# Patient Record
Sex: Female | Born: 1960 | Race: White | Hispanic: No | Marital: Married | Smoking: Never smoker
Health system: Southern US, Community
[De-identification: ages and names within clinical notes are randomized; demographics above are authoritative.]

## PROBLEM LIST (undated history)

## (undated) DIAGNOSIS — C569 Malignant neoplasm of unspecified ovary: Secondary | ICD-10-CM

## (undated) DIAGNOSIS — M069 Rheumatoid arthritis, unspecified: Secondary | ICD-10-CM

## (undated) HISTORY — PX: VAGINAL HYSTERECTOMY: SUR661

## (undated) HISTORY — DX: Rheumatoid arthritis, unspecified: M06.9

## (undated) HISTORY — DX: Malignant neoplasm of unspecified ovary: C56.9

---

## 1998-10-04 ENCOUNTER — Other Ambulatory Visit: Admission: RE | Admit: 1998-10-04 | Discharge: 1998-10-04 | Payer: Self-pay | Admitting: Obstetrics & Gynecology

## 2005-04-17 ENCOUNTER — Emergency Department (HOSPITAL_COMMUNITY): Admission: EM | Admit: 2005-04-17 | Discharge: 2005-04-18 | Payer: Self-pay | Admitting: Family Medicine

## 2005-04-26 ENCOUNTER — Ambulatory Visit: Payer: Self-pay | Admitting: Obstetrics and Gynecology

## 2005-05-21 ENCOUNTER — Ambulatory Visit: Payer: Self-pay | Admitting: Oncology

## 2005-06-22 ENCOUNTER — Encounter: Admission: RE | Admit: 2005-06-22 | Discharge: 2005-06-22 | Payer: Self-pay | Admitting: Oncology

## 2005-06-22 ENCOUNTER — Ambulatory Visit (HOSPITAL_COMMUNITY): Admission: RE | Admit: 2005-06-22 | Discharge: 2005-06-22 | Payer: Self-pay | Admitting: Oncology

## 2005-07-13 ENCOUNTER — Ambulatory Visit: Payer: Self-pay | Admitting: Oncology

## 2005-08-28 ENCOUNTER — Ambulatory Visit: Payer: Self-pay | Admitting: Oncology

## 2005-11-07 ENCOUNTER — Ambulatory Visit (HOSPITAL_COMMUNITY): Admission: RE | Admit: 2005-11-07 | Discharge: 2005-11-07 | Payer: Self-pay | Admitting: Oncology

## 2005-11-21 ENCOUNTER — Ambulatory Visit: Admission: RE | Admit: 2005-11-21 | Discharge: 2005-11-21 | Payer: Self-pay | Admitting: Gynecology

## 2006-01-01 ENCOUNTER — Ambulatory Visit (HOSPITAL_COMMUNITY): Admission: RE | Admit: 2006-01-01 | Discharge: 2006-01-01 | Payer: Self-pay | Admitting: Internal Medicine

## 2006-01-01 ENCOUNTER — Ambulatory Visit: Payer: Self-pay | Admitting: Internal Medicine

## 2006-01-02 ENCOUNTER — Ambulatory Visit (HOSPITAL_COMMUNITY): Admission: RE | Admit: 2006-01-02 | Discharge: 2006-01-02 | Payer: Self-pay | Admitting: Oncology

## 2006-01-09 ENCOUNTER — Ambulatory Visit: Payer: Self-pay | Admitting: Oncology

## 2006-02-05 ENCOUNTER — Ambulatory Visit: Payer: Self-pay | Admitting: Internal Medicine

## 2006-02-07 ENCOUNTER — Ambulatory Visit (HOSPITAL_COMMUNITY): Admission: RE | Admit: 2006-02-07 | Discharge: 2006-02-07 | Payer: Self-pay | Admitting: Family Medicine

## 2006-03-01 ENCOUNTER — Ambulatory Visit: Payer: Self-pay | Admitting: Internal Medicine

## 2006-03-12 ENCOUNTER — Ambulatory Visit: Payer: Self-pay | Admitting: Internal Medicine

## 2006-03-14 ENCOUNTER — Ambulatory Visit: Payer: Self-pay | Admitting: Oncology

## 2006-03-18 ENCOUNTER — Ambulatory Visit (HOSPITAL_COMMUNITY): Admission: RE | Admit: 2006-03-18 | Discharge: 2006-03-18 | Payer: Self-pay | Admitting: Oncology

## 2006-03-21 ENCOUNTER — Encounter: Admission: RE | Admit: 2006-03-21 | Discharge: 2006-06-19 | Payer: Self-pay | Admitting: Internal Medicine

## 2006-03-22 ENCOUNTER — Ambulatory Visit: Admission: RE | Admit: 2006-03-22 | Discharge: 2006-03-22 | Payer: Self-pay | Admitting: Gynecology

## 2006-04-02 ENCOUNTER — Ambulatory Visit: Payer: Self-pay | Admitting: Internal Medicine

## 2006-04-09 ENCOUNTER — Ambulatory Visit: Payer: Self-pay | Admitting: Internal Medicine

## 2006-04-19 LAB — CBC WITH DIFFERENTIAL/PLATELET
BASO%: 0.5 % (ref 0.0–2.0)
Basophils Absolute: 0 10*3/uL (ref 0.0–0.1)
EOS%: 2.8 % (ref 0.0–7.0)
Eosinophils Absolute: 0.2 10*3/uL (ref 0.0–0.5)
HCT: 32.6 % — ABNORMAL LOW (ref 34.8–46.6)
HGB: 10.7 g/dL — ABNORMAL LOW (ref 11.6–15.9)
LYMPH%: 33.4 % (ref 14.0–48.0)
MCH: 30.5 pg (ref 26.0–34.0)
MCHC: 33 g/dL (ref 32.0–36.0)
MCV: 92.6 fL (ref 81.0–101.0)
MONO#: 0.6 10*3/uL (ref 0.1–0.9)
MONO%: 7.9 % (ref 0.0–13.0)
NEUT#: 4.3 10*3/uL (ref 1.5–6.5)
NEUT%: 55.4 % (ref 39.6–76.8)
Platelets: 288 10*3/uL (ref 145–400)
RBC: 3.52 10*6/uL — ABNORMAL LOW (ref 3.70–5.32)
RDW: 14.6 % — ABNORMAL HIGH (ref 11.3–14.5)
WBC: 7.7 10*3/uL (ref 3.9–10.0)
lymph#: 2.6 10*3/uL (ref 0.9–3.3)

## 2006-04-19 LAB — COMPREHENSIVE METABOLIC PANEL
ALT: 32 U/L (ref 0–40)
AST: 34 U/L (ref 0–37)
Albumin: 3.6 g/dL (ref 3.5–5.2)
Alkaline Phosphatase: 51 U/L (ref 39–117)
BUN: 15 mg/dL (ref 6–23)
CO2: 28 mEq/L (ref 19–32)
Calcium: 9 mg/dL (ref 8.4–10.5)
Chloride: 102 mEq/L (ref 96–112)
Creatinine, Ser: 0.8 mg/dL (ref 0.40–1.20)
Glucose, Bld: 89 mg/dL (ref 70–99)
Potassium: 4.6 mEq/L (ref 3.5–5.3)
Sodium: 138 mEq/L (ref 135–145)
Total Bilirubin: 0.3 mg/dL (ref 0.3–1.2)
Total Protein: 7.8 g/dL (ref 6.0–8.3)

## 2006-04-22 ENCOUNTER — Observation Stay (HOSPITAL_COMMUNITY): Admission: AD | Admit: 2006-04-22 | Discharge: 2006-04-23 | Payer: Self-pay | Admitting: Internal Medicine

## 2006-04-22 ENCOUNTER — Ambulatory Visit: Payer: Self-pay | Admitting: Internal Medicine

## 2006-05-01 ENCOUNTER — Ambulatory Visit: Payer: Self-pay | Admitting: Internal Medicine

## 2006-05-17 ENCOUNTER — Ambulatory Visit: Payer: Self-pay | Admitting: Oncology

## 2006-06-13 ENCOUNTER — Ambulatory Visit: Payer: Self-pay | Admitting: Internal Medicine

## 2006-06-25 ENCOUNTER — Ambulatory Visit (HOSPITAL_COMMUNITY): Admission: RE | Admit: 2006-06-25 | Discharge: 2006-06-25 | Payer: Self-pay | Admitting: Internal Medicine

## 2006-06-28 ENCOUNTER — Ambulatory Visit: Payer: Self-pay | Admitting: Oncology

## 2006-07-03 ENCOUNTER — Encounter: Admission: RE | Admit: 2006-07-03 | Discharge: 2006-07-03 | Payer: Self-pay | Admitting: Internal Medicine

## 2006-07-22 ENCOUNTER — Ambulatory Visit: Payer: Self-pay | Admitting: Internal Medicine

## 2006-07-22 ENCOUNTER — Ambulatory Visit (HOSPITAL_COMMUNITY): Admission: RE | Admit: 2006-07-22 | Discharge: 2006-07-22 | Payer: Self-pay | Admitting: Internal Medicine

## 2006-07-29 ENCOUNTER — Ambulatory Visit: Payer: Self-pay | Admitting: Hospitalist

## 2006-07-31 ENCOUNTER — Ambulatory Visit (HOSPITAL_COMMUNITY): Admission: RE | Admit: 2006-07-31 | Discharge: 2006-07-31 | Payer: Self-pay | Admitting: *Deleted

## 2006-08-09 LAB — CBC WITH DIFFERENTIAL/PLATELET
BASO%: 0.6 % (ref 0.0–2.0)
EOS%: 2.8 % (ref 0.0–7.0)
LYMPH%: 41.6 % (ref 14.0–48.0)
MCH: 31.9 pg (ref 26.0–34.0)
MCHC: 33.8 g/dL (ref 32.0–36.0)
MCV: 94.4 fL (ref 81.0–101.0)
MONO#: 0.5 10*3/uL (ref 0.1–0.9)
MONO%: 8.8 % (ref 0.0–13.0)
Platelets: 270 10*3/uL (ref 145–400)
RBC: 3.45 10*6/uL — ABNORMAL LOW (ref 3.70–5.32)
WBC: 5.5 10*3/uL (ref 3.9–10.0)

## 2006-08-09 LAB — COMPREHENSIVE METABOLIC PANEL
ALT: 24 U/L (ref 0–40)
Alkaline Phosphatase: 50 U/L (ref 39–117)
Sodium: 137 mEq/L (ref 135–145)
Total Bilirubin: 0.3 mg/dL (ref 0.3–1.2)
Total Protein: 7.7 g/dL (ref 6.0–8.3)

## 2006-08-26 ENCOUNTER — Ambulatory Visit: Payer: Self-pay | Admitting: Internal Medicine

## 2006-09-23 ENCOUNTER — Ambulatory Visit: Payer: Self-pay | Admitting: Oncology

## 2006-10-21 ENCOUNTER — Ambulatory Visit: Payer: Self-pay | Admitting: Internal Medicine

## 2006-10-21 ENCOUNTER — Encounter (INDEPENDENT_AMBULATORY_CARE_PROVIDER_SITE_OTHER): Payer: Self-pay | Admitting: Internal Medicine

## 2006-10-21 LAB — CONVERTED CEMR LAB
AST: 40 units/L — ABNORMAL HIGH (ref 0–37)
Alkaline Phosphatase: 45 units/L (ref 39–117)
BUN: 10 mg/dL (ref 6–23)
Calcium: 9.3 mg/dL (ref 8.4–10.5)
Creatinine, Ser: 0.7 mg/dL (ref 0.40–1.20)
HCT: 34.6 % (ref 34.4–43.3)
Hemoglobin: 11.8 g/dL (ref 11.7–14.8)
MCHC: 34 g/dL (ref 33.1–35.4)
MCV: 97.3 fL (ref 78.8–100.0)
RDW: 14.5 % (ref 11.5–15.3)

## 2006-10-23 ENCOUNTER — Ambulatory Visit: Admission: RE | Admit: 2006-10-23 | Discharge: 2006-10-23 | Payer: Self-pay | Admitting: Gynecology

## 2006-10-31 ENCOUNTER — Ambulatory Visit: Payer: Self-pay | Admitting: Internal Medicine

## 2006-11-07 ENCOUNTER — Ambulatory Visit: Payer: Self-pay | Admitting: Oncology

## 2007-01-09 ENCOUNTER — Encounter (INDEPENDENT_AMBULATORY_CARE_PROVIDER_SITE_OTHER): Payer: Self-pay | Admitting: Internal Medicine

## 2007-02-05 ENCOUNTER — Ambulatory Visit: Payer: Self-pay | Admitting: Oncology

## 2007-02-06 ENCOUNTER — Encounter (INDEPENDENT_AMBULATORY_CARE_PROVIDER_SITE_OTHER): Payer: Self-pay | Admitting: Internal Medicine

## 2007-02-06 LAB — COMPREHENSIVE METABOLIC PANEL
Albumin: 3.6 g/dL (ref 3.5–5.2)
BUN: 14 mg/dL (ref 6–23)
CO2: 28 mEq/L (ref 19–32)
Calcium: 9.5 mg/dL (ref 8.4–10.5)
Chloride: 103 mEq/L (ref 96–112)
Glucose, Bld: 80 mg/dL (ref 70–99)
Potassium: 4.3 mEq/L (ref 3.5–5.3)
Sodium: 138 mEq/L (ref 135–145)
Total Protein: 8 g/dL (ref 6.0–8.3)

## 2007-02-06 LAB — CBC WITH DIFFERENTIAL/PLATELET
Basophils Absolute: 0 10*3/uL (ref 0.0–0.1)
Eosinophils Absolute: 0.1 10*3/uL (ref 0.0–0.5)
HGB: 12 g/dL (ref 11.6–15.9)
MCV: 95.5 fL (ref 81.0–101.0)
MONO#: 0.5 10*3/uL (ref 0.1–0.9)
MONO%: 8.4 % (ref 0.0–13.0)
NEUT#: 3.1 10*3/uL (ref 1.5–6.5)
RBC: 3.57 10*6/uL — ABNORMAL LOW (ref 3.70–5.32)
RDW: 13.9 % (ref 11.3–14.5)
WBC: 5.7 10*3/uL (ref 3.9–10.0)
lymph#: 2 10*3/uL (ref 0.9–3.3)

## 2007-02-17 ENCOUNTER — Ambulatory Visit (HOSPITAL_COMMUNITY): Admission: RE | Admit: 2007-02-17 | Discharge: 2007-02-17 | Payer: Self-pay | Admitting: Oncology

## 2007-02-17 IMAGING — US US PELVIS COMPLETE MODIFY
1 series · 14 of 25 positions shown · non-contrast
Comparison: none

CLINICAL DATA: Pelvic pain and swelling.
 TRANSABDOMINAL AND TRANSVAGINAL PELVIC ULTRASOUND:
TECHNIQUE: Both transabdominal and transvaginal ultrasound examinations of the pelvis were performed including evaluation of the uterus, ovaries, adnexal regions, and pelvic cul-de-sac.
 The uterus is anteverted measuring 10.5 x 6.6 x 6.9 cm.  Endometrial stripe is homogeneous measuring 11 mm in greatest diameter.  There is a large complex cystic/solid right adnexal mass measuring 16 x 9 x 12 cm.  Right ovary is not well visualized.  Limited evaluation of left ovary which is grossly unremarkable.  Small amount of free fluid in the pelvis.

[Series 1: unknown · 0.25mm/px · 14 of 61 slices shown]
[im 1/61]
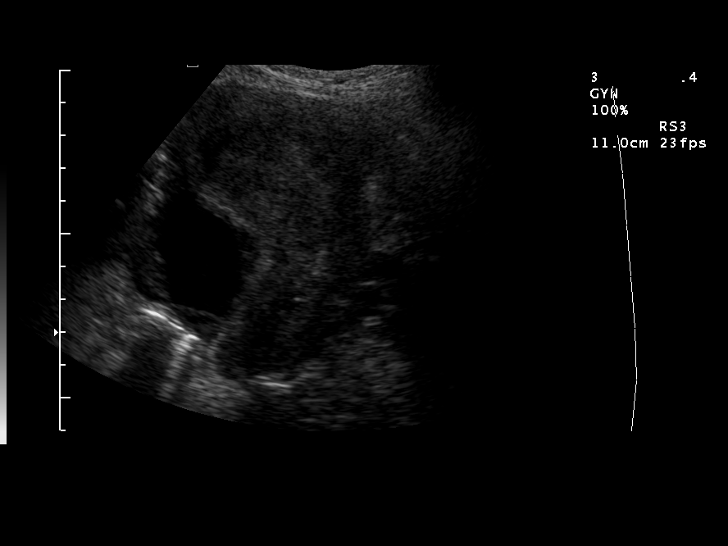
[im 6/61]
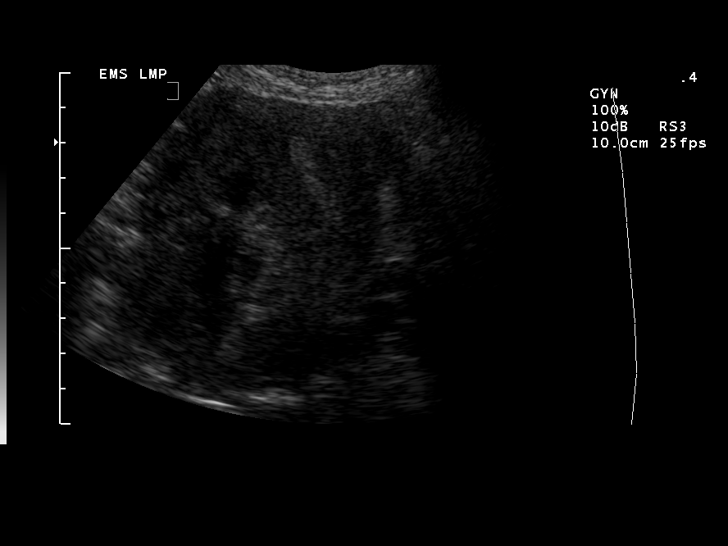
[im 11/61]
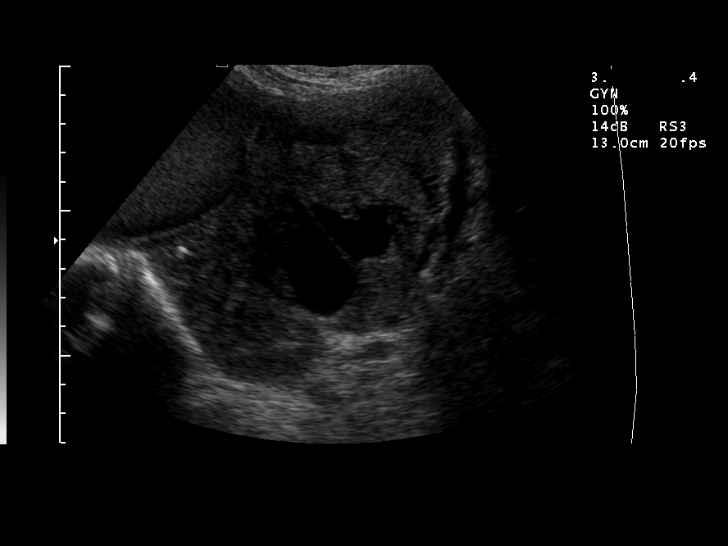
[im 16/61]
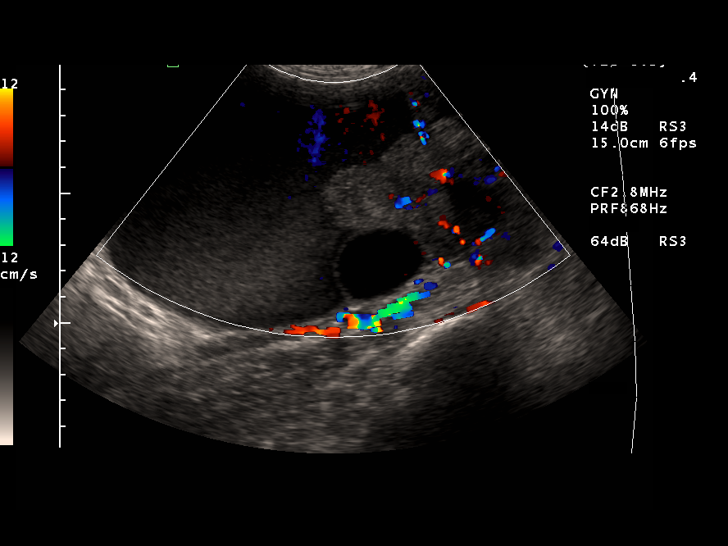
[im 21/61]
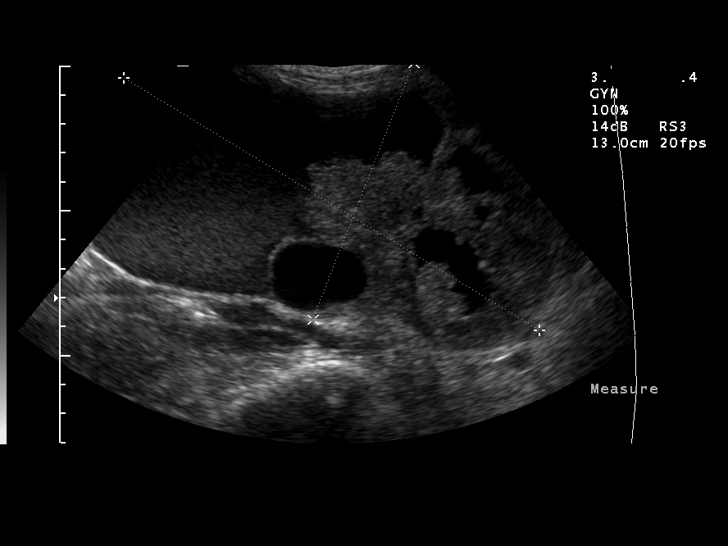
[im 23/61]
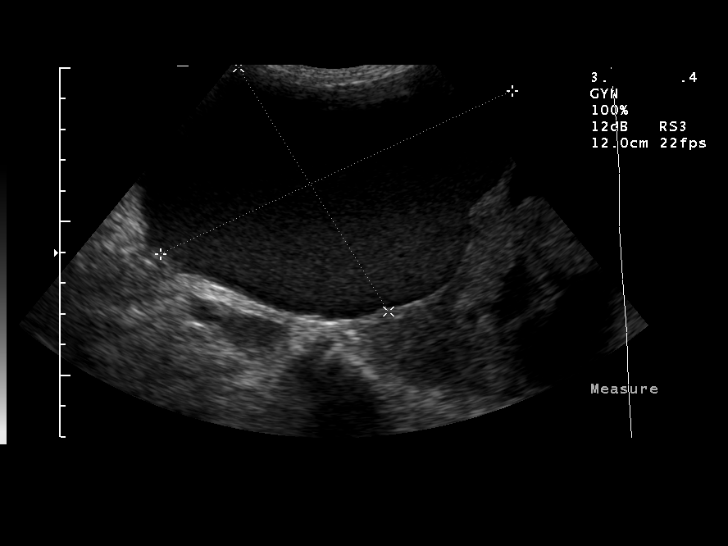
[im 28/61]
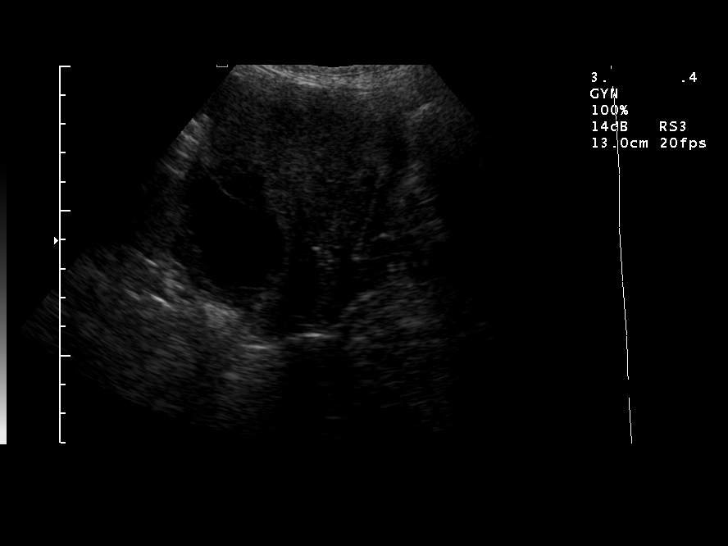
[im 33/61]
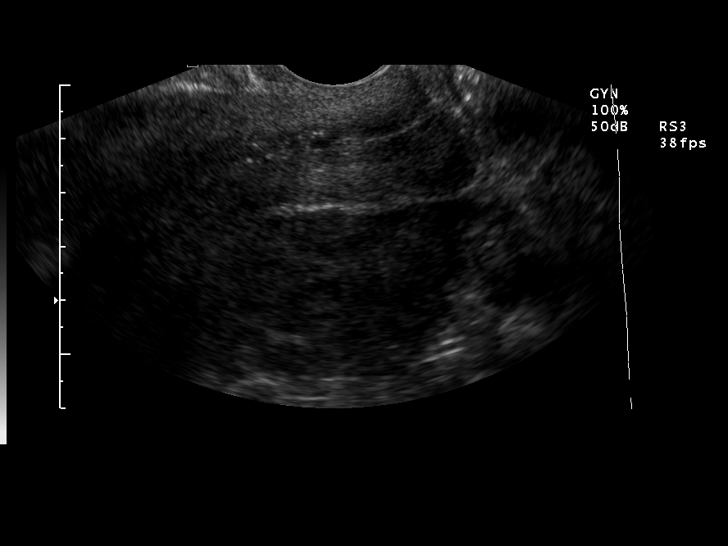
[im 38/61]
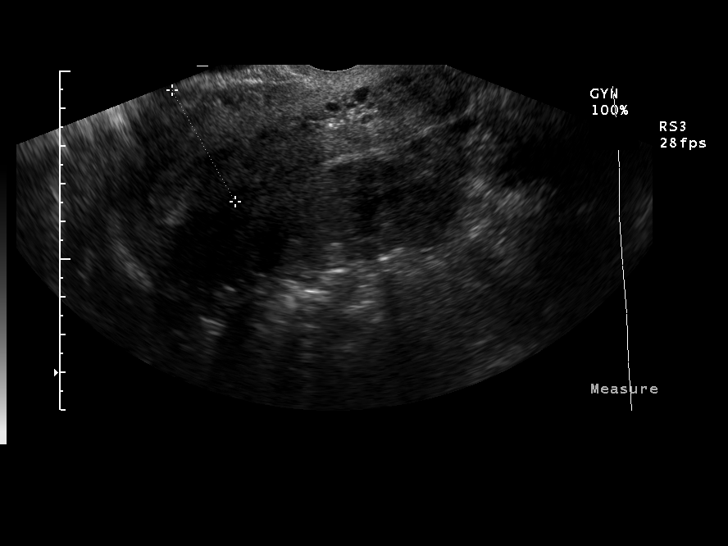
[im 41/61]
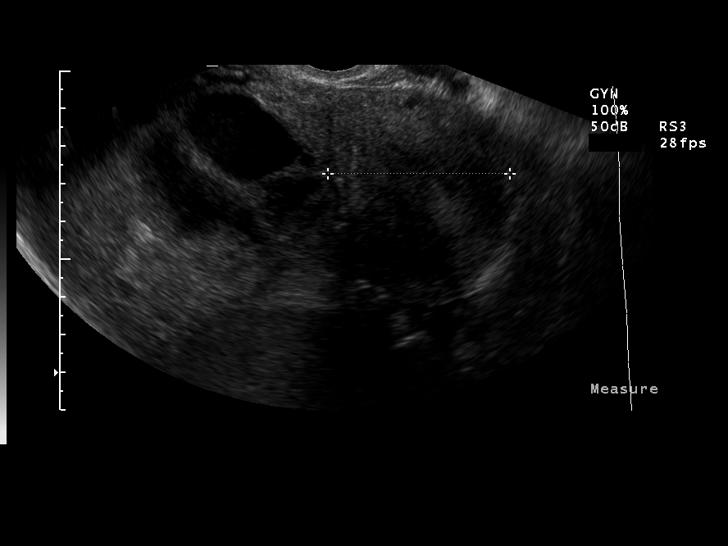
[im 46/61]
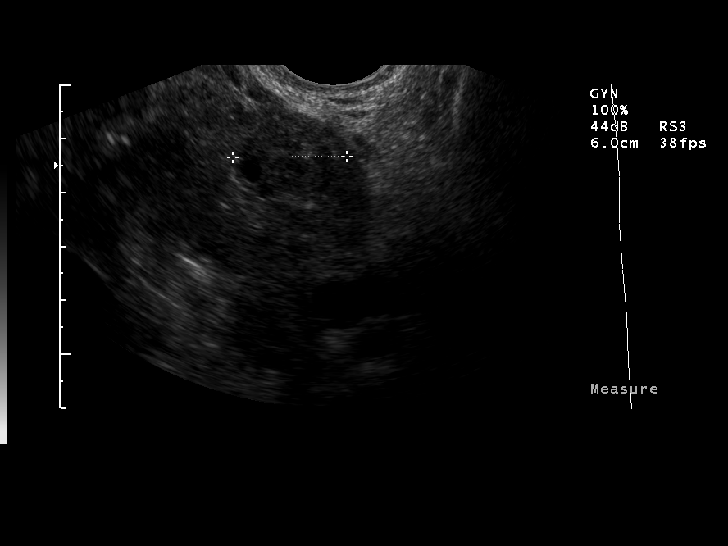
[im 51/61]
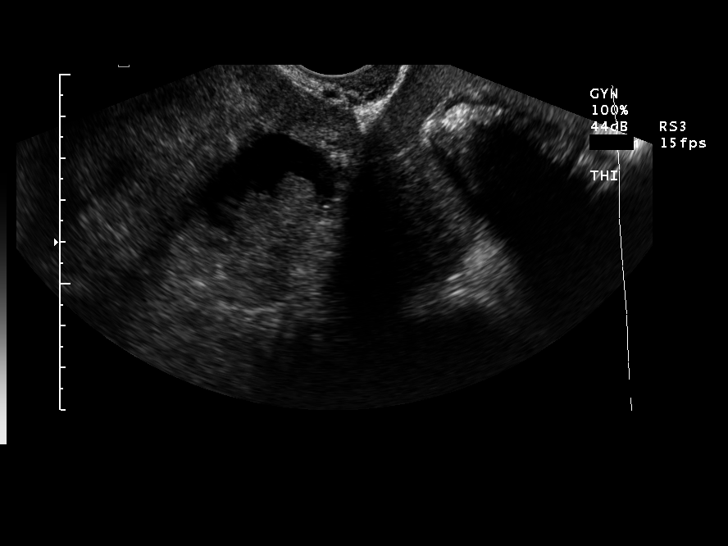
[im 56/61]
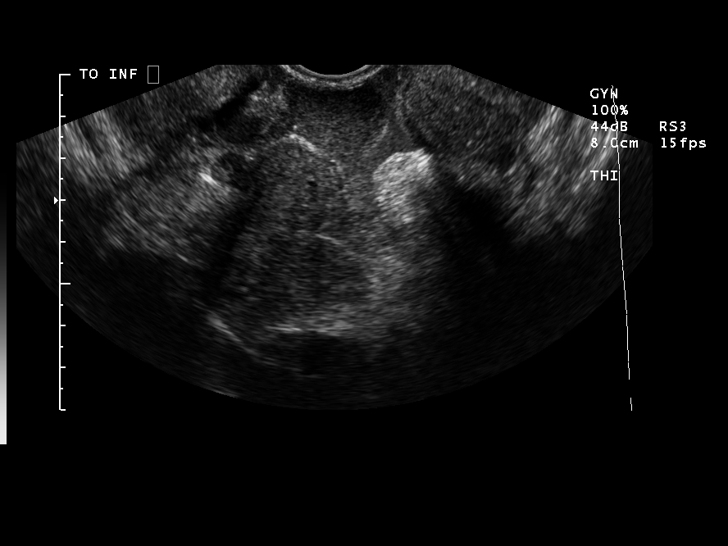
[im 61/61]
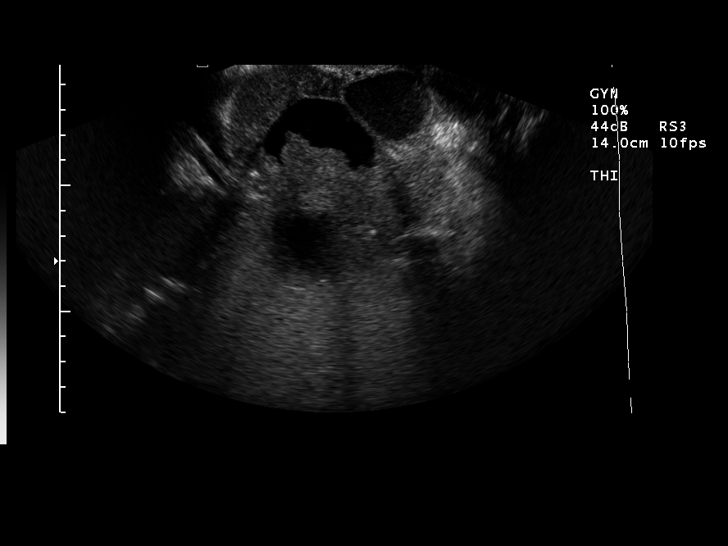

[14 of 25 positions shown; findings below may reference images not displayed]

IMPRESSION: 1.  16 x 9 x 12 cm complex cystic and solid right adnexal mass, worrisome for neoplasm.  Recommend further evaluation.  
 2.  Right ovary not well visualized.

## 2007-02-24 ENCOUNTER — Encounter (INDEPENDENT_AMBULATORY_CARE_PROVIDER_SITE_OTHER): Payer: Self-pay | Admitting: Internal Medicine

## 2007-02-24 ENCOUNTER — Ambulatory Visit: Payer: Self-pay | Admitting: Hospitalist

## 2007-02-24 DIAGNOSIS — M899 Disorder of bone, unspecified: Secondary | ICD-10-CM | POA: Insufficient documentation

## 2007-02-24 DIAGNOSIS — R5383 Other fatigue: Secondary | ICD-10-CM

## 2007-02-24 DIAGNOSIS — R5381 Other malaise: Secondary | ICD-10-CM

## 2007-02-24 DIAGNOSIS — M069 Rheumatoid arthritis, unspecified: Secondary | ICD-10-CM | POA: Insufficient documentation

## 2007-02-24 DIAGNOSIS — C569 Malignant neoplasm of unspecified ovary: Secondary | ICD-10-CM

## 2007-02-24 DIAGNOSIS — I319 Disease of pericardium, unspecified: Secondary | ICD-10-CM | POA: Insufficient documentation

## 2007-02-24 DIAGNOSIS — M545 Low back pain: Secondary | ICD-10-CM | POA: Insufficient documentation

## 2007-02-24 DIAGNOSIS — M949 Disorder of cartilage, unspecified: Secondary | ICD-10-CM

## 2007-03-12 ENCOUNTER — Telehealth (INDEPENDENT_AMBULATORY_CARE_PROVIDER_SITE_OTHER): Payer: Self-pay | Admitting: Internal Medicine

## 2007-03-20 ENCOUNTER — Encounter (INDEPENDENT_AMBULATORY_CARE_PROVIDER_SITE_OTHER): Payer: Self-pay | Admitting: Internal Medicine

## 2007-03-20 ENCOUNTER — Ambulatory Visit: Payer: Self-pay | Admitting: Internal Medicine

## 2007-03-20 LAB — CONVERTED CEMR LAB
ALT: 38 units/L — ABNORMAL HIGH (ref 0–35)
BUN: 18 mg/dL (ref 6–23)
BUN: 13 mg/dL (ref 6–23)
Basophils Absolute: 0 10*3/uL (ref 0.0–0.1)
CO2: 24 meq/L (ref 19–32)
CO2: 26 meq/L (ref 19–32)
Calcium: 9.4 mg/dL (ref 8.4–10.5)
Chloride: 104 meq/L (ref 96–112)
Creatinine, Ser: 0.87 mg/dL (ref 0.40–1.20)
Creatinine, Ser: 0.72 mg/dL (ref 0.40–1.20)
Eosinophils Relative: 3 % (ref 0–5)
Eosinophils Relative: 4 % (ref 0–5)
Glucose, Bld: 89 mg/dL (ref 70–99)
HCT: 36.6 % (ref 36.0–46.0)
HCT: 37.3 % (ref 36.0–46.0)
Hemoglobin: 11.8 g/dL — ABNORMAL LOW (ref 12.0–15.0)
Hemoglobin: 11.9 g/dL — ABNORMAL LOW (ref 12.0–15.0)
Lymphocytes Relative: 32 % (ref 12–46)
Lymphocytes Relative: 43 % (ref 12–46)
Lymphs Abs: 2 10*3/uL (ref 0.7–3.3)
MCHC: 32.2 g/dL (ref 30.0–36.0)
Monocytes Absolute: 0.5 10*3/uL (ref 0.2–0.7)
Monocytes Absolute: 0.4 10*3/uL (ref 0.2–0.7)
Monocytes Relative: 8 % (ref 3–11)
Monocytes Relative: 9 % (ref 3–11)
Neutro Abs: 2.1 10*3/uL (ref 1.7–7.7)
RBC: 3.72 M/uL — ABNORMAL LOW (ref 3.87–5.11)
RDW: 13.9 % (ref 11.5–14.0)
RDW: 14.2 % — ABNORMAL HIGH (ref 11.5–14.0)
TSH: 2.043 microintl units/mL (ref 0.350–5.50)
Total Bilirubin: 0.2 mg/dL — ABNORMAL LOW (ref 0.3–1.2)
Total Bilirubin: 0.2 mg/dL — ABNORMAL LOW (ref 0.3–1.2)
WBC: 4.8 10*3/uL (ref 4.0–10.5)

## 2007-04-03 ENCOUNTER — Ambulatory Visit: Payer: Self-pay | Admitting: Oncology

## 2007-04-23 ENCOUNTER — Encounter (INDEPENDENT_AMBULATORY_CARE_PROVIDER_SITE_OTHER): Payer: Self-pay | Admitting: Internal Medicine

## 2007-05-06 ENCOUNTER — Encounter (INDEPENDENT_AMBULATORY_CARE_PROVIDER_SITE_OTHER): Payer: Self-pay | Admitting: Internal Medicine

## 2007-05-06 ENCOUNTER — Ambulatory Visit: Payer: Self-pay | Admitting: Internal Medicine

## 2007-05-09 ENCOUNTER — Ambulatory Visit: Admission: RE | Admit: 2007-05-09 | Discharge: 2007-05-09 | Payer: Self-pay | Admitting: Gynecology

## 2007-05-09 LAB — CONVERTED CEMR LAB
ALT: 71 units/L — ABNORMAL HIGH (ref 0–35)
CO2: 27 meq/L (ref 19–32)
Calcium: 9.5 mg/dL (ref 8.4–10.5)
Chloride: 102 meq/L (ref 96–112)
Creatinine, Ser: 0.83 mg/dL (ref 0.40–1.20)
Glucose, Bld: 78 mg/dL (ref 70–99)
HCT: 36.4 % (ref 36.0–46.0)
MCV: 99.5 fL (ref 78.0–100.0)
Platelets: 232 10*3/uL (ref 150–400)
RBC: 3.66 M/uL — ABNORMAL LOW (ref 3.87–5.11)
Total Bilirubin: 0.4 mg/dL (ref 0.3–1.2)
Total Protein: 7.4 g/dL (ref 6.0–8.3)
WBC: 4.4 10*3/uL (ref 4.0–10.5)

## 2007-05-22 ENCOUNTER — Ambulatory Visit (HOSPITAL_COMMUNITY): Admission: RE | Admit: 2007-05-22 | Discharge: 2007-05-22 | Payer: Self-pay | Admitting: Gynecology

## 2007-05-22 ENCOUNTER — Encounter (INDEPENDENT_AMBULATORY_CARE_PROVIDER_SITE_OTHER): Payer: Self-pay | Admitting: Interventional Radiology

## 2007-05-23 ENCOUNTER — Encounter: Payer: Self-pay | Admitting: Interventional Radiology

## 2007-05-30 ENCOUNTER — Ambulatory Visit (HOSPITAL_COMMUNITY): Admission: RE | Admit: 2007-05-30 | Discharge: 2007-05-30 | Payer: Self-pay | Admitting: Gynecology

## 2007-06-06 ENCOUNTER — Ambulatory Visit: Admission: RE | Admit: 2007-06-06 | Discharge: 2007-06-06 | Payer: Self-pay | Admitting: Gynecology

## 2007-06-10 ENCOUNTER — Encounter (INDEPENDENT_AMBULATORY_CARE_PROVIDER_SITE_OTHER): Payer: Self-pay | Admitting: Internal Medicine

## 2007-06-10 ENCOUNTER — Ambulatory Visit: Payer: Self-pay | Admitting: Oncology

## 2007-06-10 LAB — CBC WITH DIFFERENTIAL/PLATELET
Eosinophils Absolute: 0.1 10*3/uL (ref 0.0–0.5)
HCT: 34.5 % — ABNORMAL LOW (ref 34.8–46.6)
LYMPH%: 32.7 % (ref 14.0–48.0)
MONO#: 0.5 10*3/uL (ref 0.1–0.9)
NEUT#: 2.7 10*3/uL (ref 1.5–6.5)
NEUT%: 53.7 % (ref 39.6–76.8)
Platelets: 302 10*3/uL (ref 145–400)
WBC: 5.1 10*3/uL (ref 3.9–10.0)

## 2007-06-10 LAB — COMPREHENSIVE METABOLIC PANEL
CO2: 25 mEq/L (ref 19–32)
Creatinine, Ser: 0.8 mg/dL (ref 0.40–1.20)
Glucose, Bld: 93 mg/dL (ref 70–99)
Total Bilirubin: 0.3 mg/dL (ref 0.3–1.2)

## 2007-06-30 ENCOUNTER — Encounter (INDEPENDENT_AMBULATORY_CARE_PROVIDER_SITE_OTHER): Payer: Self-pay | Admitting: Internal Medicine

## 2007-06-30 ENCOUNTER — Ambulatory Visit: Payer: Self-pay | Admitting: Internal Medicine

## 2007-07-02 LAB — CONVERTED CEMR LAB
Albumin: 3.5 g/dL (ref 3.5–5.2)
BUN: 10 mg/dL (ref 6–23)
CO2: 29 meq/L (ref 19–32)
Calcium: 9.5 mg/dL (ref 8.4–10.5)
Chloride: 102 meq/L (ref 96–112)
Glucose, Bld: 82 mg/dL (ref 70–99)
Lymphocytes Relative: 50 % — ABNORMAL HIGH (ref 12–46)
Lymphs Abs: 1.8 10*3/uL (ref 0.7–3.3)
MCV: 94.2 fL (ref 78.0–100.0)
Monocytes Relative: 16 % — ABNORMAL HIGH (ref 3–11)
Neutrophils Relative %: 31 % — ABNORMAL LOW (ref 43–77)
Platelets: 176 10*3/uL (ref 150–400)
Potassium: 3.7 meq/L (ref 3.5–5.3)
RBC: 3.6 M/uL — ABNORMAL LOW (ref 3.87–5.11)
Sodium: 136 meq/L (ref 135–145)
Total Protein: 7.4 g/dL (ref 6.0–8.3)
WBC: 3.6 10*3/uL — ABNORMAL LOW (ref 4.0–10.5)

## 2007-07-03 LAB — CBC WITH DIFFERENTIAL/PLATELET
Eosinophils Absolute: 0.1 10*3/uL (ref 0.0–0.5)
HCT: 33.5 % — ABNORMAL LOW (ref 34.8–46.6)
LYMPH%: 44.4 % (ref 14.0–48.0)
MONO#: 0.3 10*3/uL (ref 0.1–0.9)
NEUT#: 1.7 10*3/uL (ref 1.5–6.5)
NEUT%: 44.2 % (ref 39.6–76.8)
Platelets: 156 10*3/uL (ref 145–400)
RBC: 3.57 10*6/uL — ABNORMAL LOW (ref 3.70–5.32)
WBC: 3.7 10*3/uL — ABNORMAL LOW (ref 3.9–10.0)

## 2007-07-03 LAB — COMPREHENSIVE METABOLIC PANEL
AST: 38 U/L — ABNORMAL HIGH (ref 0–37)
Albumin: 3.5 g/dL (ref 3.5–5.2)
Alkaline Phosphatase: 40 U/L (ref 39–117)
BUN: 15 mg/dL (ref 6–23)
Glucose, Bld: 132 mg/dL — ABNORMAL HIGH (ref 70–99)
Potassium: 4 mEq/L (ref 3.5–5.3)
Sodium: 137 mEq/L (ref 135–145)
Total Bilirubin: 0.3 mg/dL (ref 0.3–1.2)

## 2007-07-10 ENCOUNTER — Ambulatory Visit (HOSPITAL_COMMUNITY): Admission: RE | Admit: 2007-07-10 | Discharge: 2007-07-10 | Payer: Self-pay | Admitting: Internal Medicine

## 2007-07-16 ENCOUNTER — Encounter: Admission: RE | Admit: 2007-07-16 | Discharge: 2007-07-16 | Payer: Self-pay | Admitting: Internal Medicine

## 2007-07-22 ENCOUNTER — Ambulatory Visit: Payer: Self-pay | Admitting: Oncology

## 2007-07-23 ENCOUNTER — Ambulatory Visit: Payer: Self-pay | Admitting: Psychiatry

## 2007-07-24 ENCOUNTER — Encounter (INDEPENDENT_AMBULATORY_CARE_PROVIDER_SITE_OTHER): Payer: Self-pay | Admitting: Internal Medicine

## 2007-07-24 LAB — CBC WITH DIFFERENTIAL/PLATELET
Basophils Absolute: 0 10*3/uL (ref 0.0–0.1)
Eosinophils Absolute: 0 10*3/uL (ref 0.0–0.5)
HCT: 28.9 % — ABNORMAL LOW (ref 34.8–46.6)
LYMPH%: 53.6 % — ABNORMAL HIGH (ref 14.0–48.0)
MCV: 92.9 fL (ref 81.0–101.0)
MONO#: 0.5 10*3/uL (ref 0.1–0.9)
MONO%: 13 % (ref 0.0–13.0)
NEUT#: 1.2 10*3/uL — ABNORMAL LOW (ref 1.5–6.5)
NEUT%: 31.5 % — ABNORMAL LOW (ref 39.6–76.8)
Platelets: 194 10*3/uL (ref 145–400)
RBC: 3.11 10*6/uL — ABNORMAL LOW (ref 3.70–5.32)
WBC: 3.7 10*3/uL — ABNORMAL LOW (ref 3.9–10.0)

## 2007-07-24 LAB — COMPREHENSIVE METABOLIC PANEL
Alkaline Phosphatase: 43 U/L (ref 39–117)
BUN: 13 mg/dL (ref 6–23)
CO2: 30 mEq/L (ref 19–32)
Creatinine, Ser: 0.62 mg/dL (ref 0.40–1.20)
Glucose, Bld: 88 mg/dL (ref 70–99)
Sodium: 138 mEq/L (ref 135–145)
Total Bilirubin: 0.6 mg/dL (ref 0.3–1.2)
Total Protein: 7.3 g/dL (ref 6.0–8.3)

## 2007-07-24 LAB — CA 125: CA 125: 14.9 U/mL (ref 0.0–30.2)

## 2007-08-06 ENCOUNTER — Ambulatory Visit: Payer: Self-pay | Admitting: Psychiatry

## 2007-08-12 ENCOUNTER — Ambulatory Visit: Payer: Self-pay | Admitting: Psychiatry

## 2007-08-13 ENCOUNTER — Encounter (INDEPENDENT_AMBULATORY_CARE_PROVIDER_SITE_OTHER): Payer: Self-pay | Admitting: Internal Medicine

## 2007-08-13 LAB — COMPREHENSIVE METABOLIC PANEL
ALT: 27 U/L (ref 0–35)
AST: 23 U/L (ref 0–37)
Albumin: 3.9 g/dL (ref 3.5–5.2)
Alkaline Phosphatase: 53 U/L (ref 39–117)
Potassium: 3.9 mEq/L (ref 3.5–5.3)
Sodium: 140 mEq/L (ref 135–145)
Total Protein: 7.6 g/dL (ref 6.0–8.3)

## 2007-08-13 LAB — CBC WITH DIFFERENTIAL/PLATELET
BASO%: 0.9 % (ref 0.0–2.0)
Eosinophils Absolute: 0 10*3/uL (ref 0.0–0.5)
HCT: 27.6 % — ABNORMAL LOW (ref 34.8–46.6)
MCHC: 35.7 g/dL (ref 32.0–36.0)
MONO#: 0.5 10*3/uL (ref 0.1–0.9)
NEUT#: 2.5 10*3/uL (ref 1.5–6.5)
NEUT%: 50.4 % (ref 39.6–76.8)
Platelets: 235 10*3/uL (ref 145–400)
WBC: 5 10*3/uL (ref 3.9–10.0)
lymph#: 1.9 10*3/uL (ref 0.9–3.3)

## 2007-08-14 ENCOUNTER — Ambulatory Visit: Payer: Self-pay | Admitting: Psychiatry

## 2007-08-26 ENCOUNTER — Ambulatory Visit: Payer: Self-pay | Admitting: Psychiatry

## 2007-09-01 ENCOUNTER — Ambulatory Visit: Payer: Self-pay | Admitting: Oncology

## 2007-09-02 ENCOUNTER — Encounter (INDEPENDENT_AMBULATORY_CARE_PROVIDER_SITE_OTHER): Payer: Self-pay | Admitting: Internal Medicine

## 2007-09-02 LAB — COMPREHENSIVE METABOLIC PANEL
ALT: 24 U/L (ref 0–35)
AST: 24 U/L (ref 0–37)
Albumin: 4 g/dL (ref 3.5–5.2)
CO2: 24 mEq/L (ref 19–32)
Calcium: 9.4 mg/dL (ref 8.4–10.5)
Chloride: 106 mEq/L (ref 96–112)
Potassium: 4 mEq/L (ref 3.5–5.3)
Sodium: 140 mEq/L (ref 135–145)
Total Protein: 7.5 g/dL (ref 6.0–8.3)

## 2007-09-02 LAB — CBC WITH DIFFERENTIAL/PLATELET
BASO%: 0.9 % (ref 0.0–2.0)
EOS%: 0.6 % (ref 0.0–7.0)
MCH: 35.2 pg — ABNORMAL HIGH (ref 26.0–34.0)
MCHC: 35.1 g/dL (ref 32.0–36.0)
MONO#: 0.3 10*3/uL (ref 0.1–0.9)
RDW: 19.9 % — ABNORMAL HIGH (ref 11.3–14.5)
WBC: 3.7 10*3/uL — ABNORMAL LOW (ref 3.9–10.0)
lymph#: 2.4 10*3/uL (ref 0.9–3.3)

## 2007-09-02 LAB — CA 125: CA 125: 8.6 U/mL (ref 0.0–30.2)

## 2007-09-03 ENCOUNTER — Ambulatory Visit: Payer: Self-pay | Admitting: Psychiatry

## 2007-09-04 LAB — CBC WITH DIFFERENTIAL/PLATELET
Eosinophils Absolute: 0.1 10*3/uL (ref 0.0–0.5)
LYMPH%: 62.1 % — ABNORMAL HIGH (ref 14.0–48.0)
MCH: 34.5 pg — ABNORMAL HIGH (ref 26.0–34.0)
MCHC: 34.2 g/dL (ref 32.0–36.0)
MCV: 101 fL (ref 81.0–101.0)
MONO%: 9.7 % (ref 0.0–13.0)
NEUT#: 0.9 10*3/uL — ABNORMAL LOW (ref 1.5–6.5)
Platelets: 92 10*3/uL — ABNORMAL LOW (ref 145–400)
RBC: 2.94 10*6/uL — ABNORMAL LOW (ref 3.70–5.32)

## 2007-09-04 LAB — COMPREHENSIVE METABOLIC PANEL
Alkaline Phosphatase: 32 U/L — ABNORMAL LOW (ref 39–117)
Glucose, Bld: 74 mg/dL (ref 70–99)
Sodium: 140 mEq/L (ref 135–145)
Total Bilirubin: 0.3 mg/dL (ref 0.3–1.2)
Total Protein: 7.2 g/dL (ref 6.0–8.3)

## 2007-09-08 IMAGING — CT CT ABDOMEN W/ CM
1 of 4 series · 13 of 32 positions shown, 18 images · IV contrast (omnipaque)
Comparison: Pelvic ultrasound 04/18/2005.

CLINICAL DATA: Ovarian cancer.
 CHEST CT WITH CONTRAST:
TECHNIQUE: Multidetector CT imaging of the chest was performed following the standard protocol during bolus administration of intravenous contrast.
 Contrast:  418cc Omnipaque 300.
TECHNIQUE: Multidetector CT imaging of the abdomen was performed following the standard protocol during bolus administration of intravenous contrast.
TECHNIQUE: Multidetector CT imaging of the pelvis was performed following the standard protocol during bolus administration of intravenous contrast.

[Series 2: cap w/iv 5.0 b30f · axial · 0.74mm/px · z∈[-581,-41]mm · 13 of 124 slices shown, 18 images]
[im 8/124  soft-tissue]
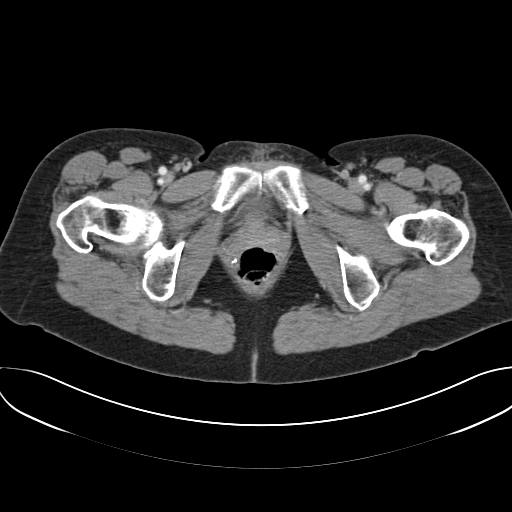
[im 8/124  bone]
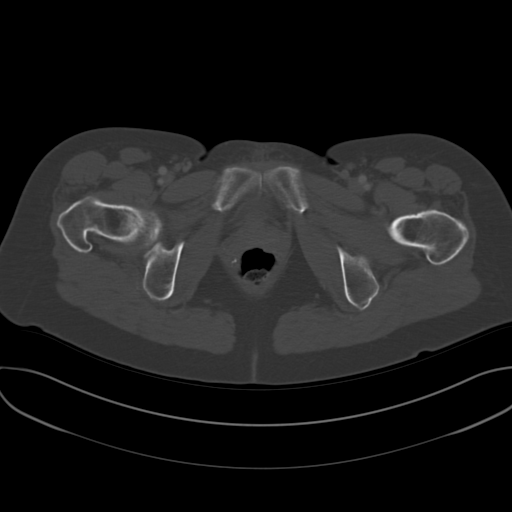
[im 16/124  soft-tissue]
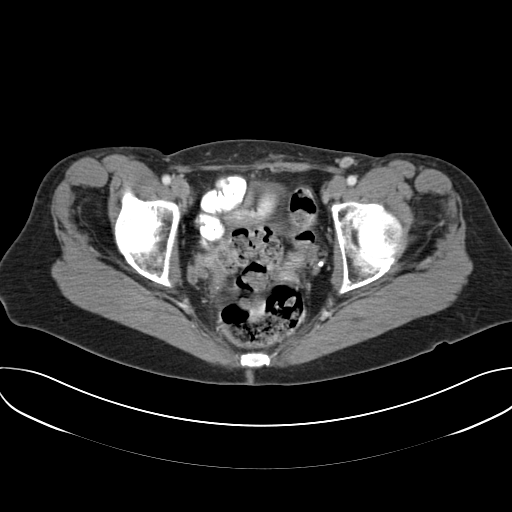
[im 31/124  soft-tissue]
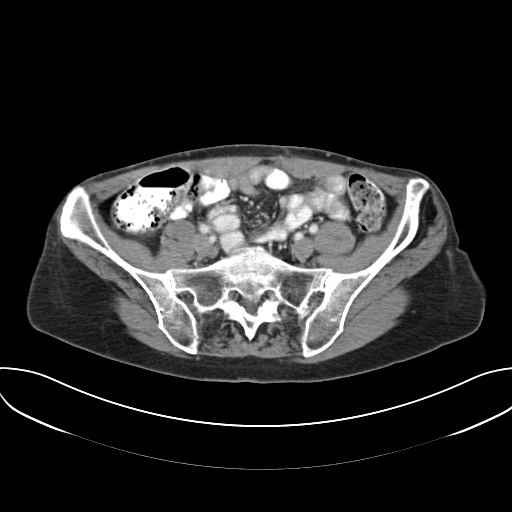
[im 39/124  soft-tissue]
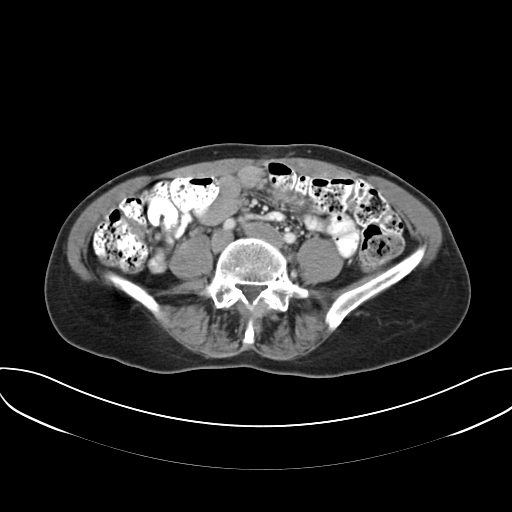
[im 47/124  soft-tissue]
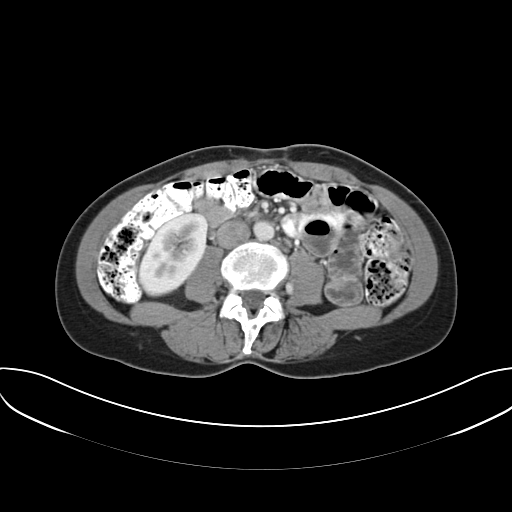
[im 54/124  soft-tissue]
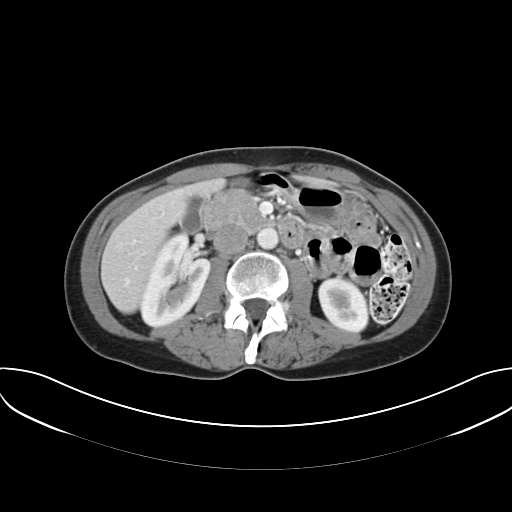
[im 70/124  soft-tissue]
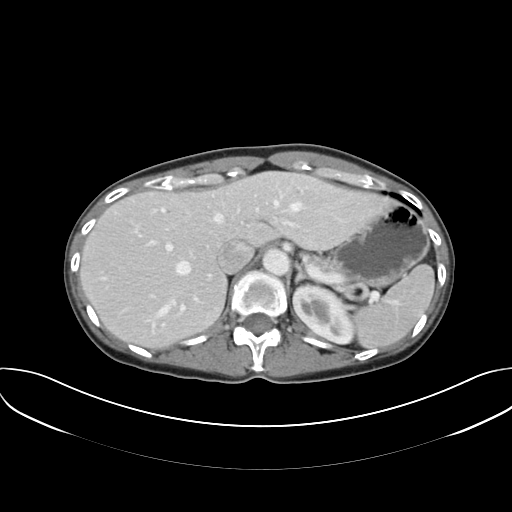
[im 77/124  soft-tissue]
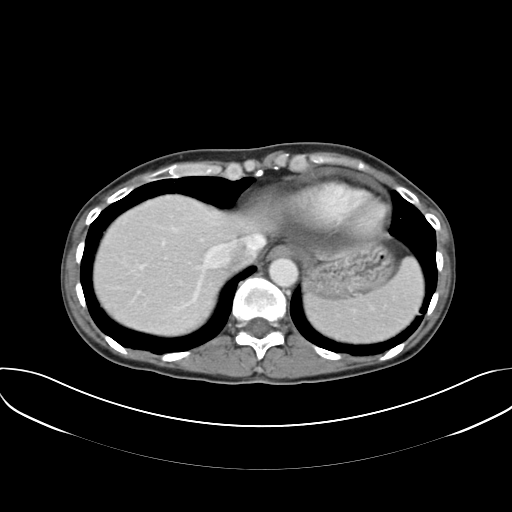
[im 85/124  soft-tissue]
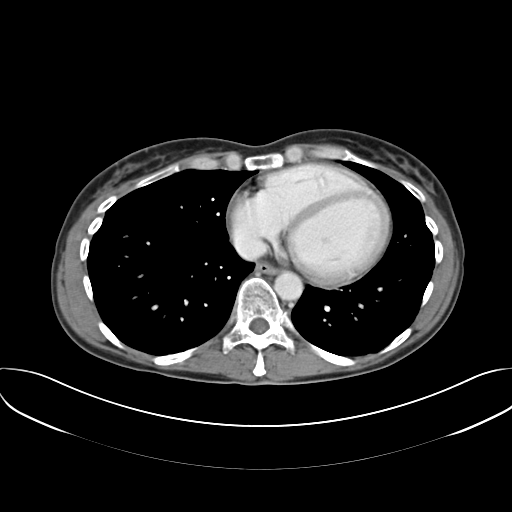
[im 85/124  bone]
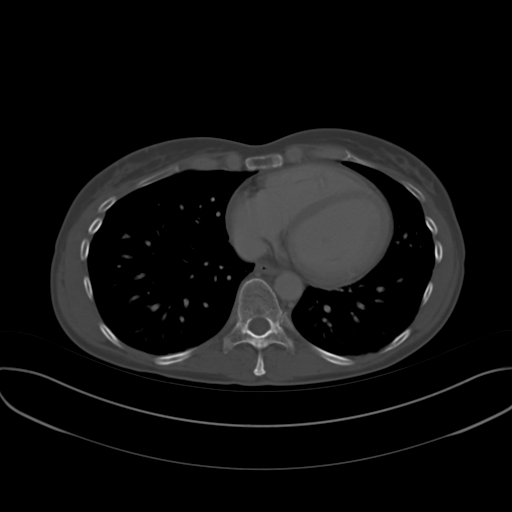
[im 93/124  soft-tissue]
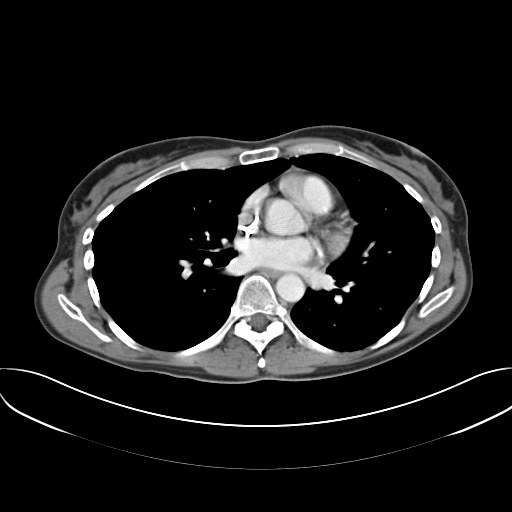
[im 93/124  lung]
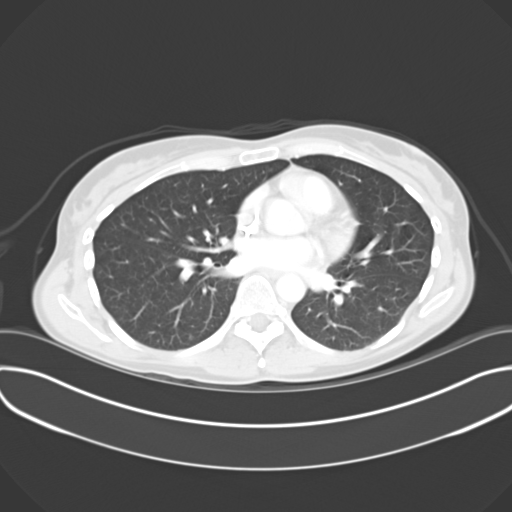
[im 100/124  lung]
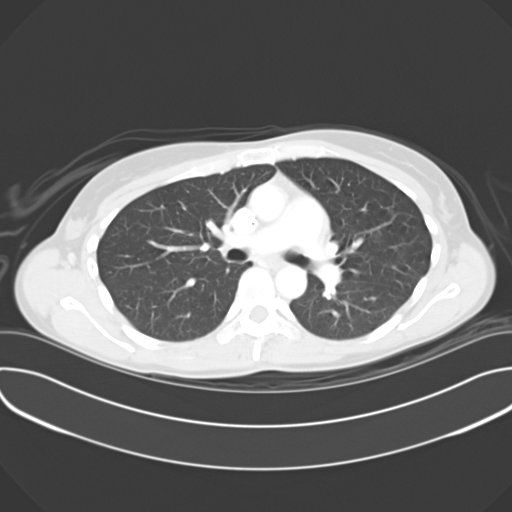
[im 108/124  soft-tissue]
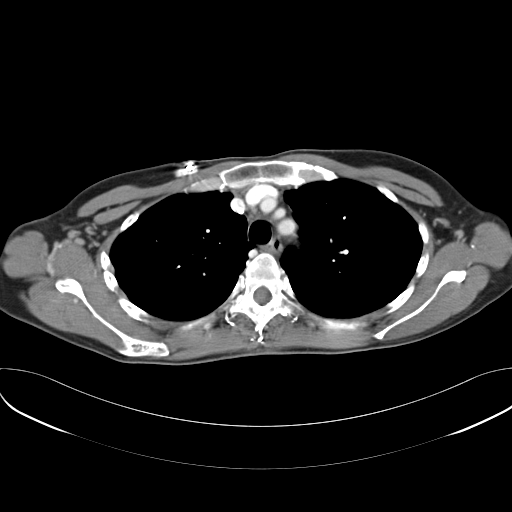
[im 108/124  lung]
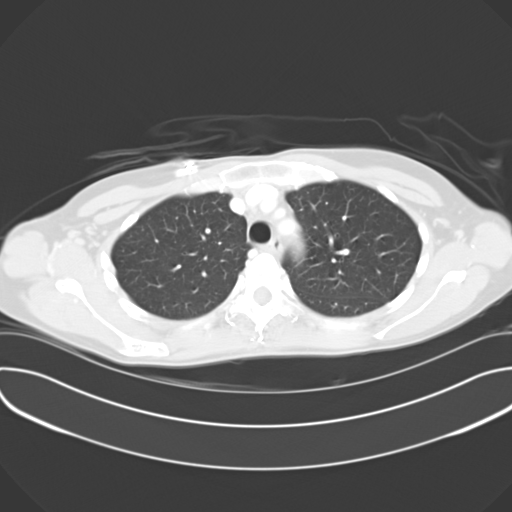
[im 116/124  soft-tissue]
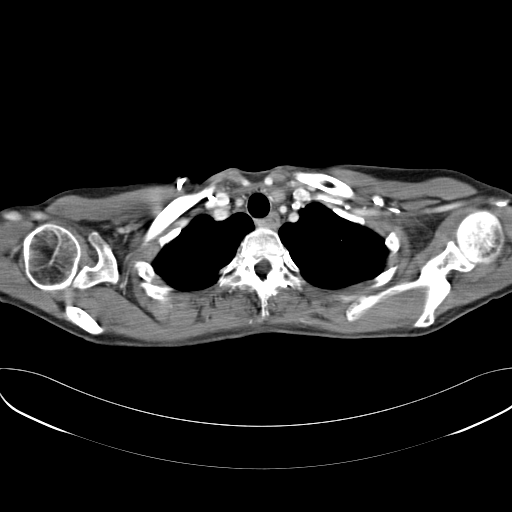
[im 116/124  lung]
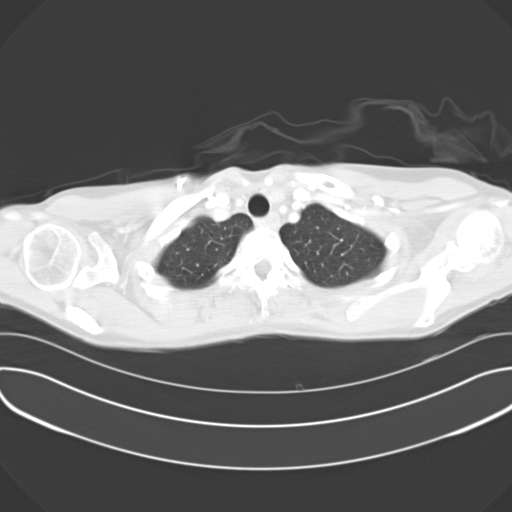

[13 of 32 positions shown; findings below may reference images not displayed]

FINDINGS: Moderate pericardial effusion noted.  No pleural effusion or pleural mass identified.  No mediastinal or hilar adenopathy.
IMPRESSION: Moderate pericardial effusion.
 ABDOMEN CT WITH CONTRAST:
FINDINGS: The liver, spleen, pancreas, adrenal glands, and kidneys appear unremarkable.  No gastric mass is identified.  Small retroperitoneal nodes are not pathologically enlarged by CT size criteria.  No definite omental mass is visualized.  Appendix appears normal.  There are no compelling signs of upper abdominal malignancy.
IMPRESSION: No definite upper abdominal malignancy is identified.  Small mesenteric and retroperitoneal lymph nodes are not pathologically enlarged by CT size criteria on today?s exam. 
 PELVIS CT WITH CONTRAST:
FINDINGS: Gluteus minimus musculature appears fatty and atrophic bilaterally.  Urinary bladder appears unremarkable.  No inguinal adenopathy.  Small common iliac nodes are present bilaterally but do not appear pathologically enlarged.  Prior hysterectomy and oophorectomy noted.  Ill definition of tissue planes in the right posterior pelvis on image #107 of series 2 is likely postoperative or related to prior therapy.  Continued monitoring is likely warranted.
IMPRESSION: 1. Currently, there is no specific evidence for pelvic malignancy.  No discrete pelvic masses identified.
 2. There is fatty atrophy of both gluteus minimus muscles.

## 2007-09-18 ENCOUNTER — Ambulatory Visit: Payer: Self-pay | Admitting: Psychiatry

## 2007-09-25 LAB — COMPREHENSIVE METABOLIC PANEL
ALT: 27 U/L (ref 0–35)
AST: 33 U/L (ref 0–37)
Albumin: 3.8 g/dL (ref 3.5–5.2)
Alkaline Phosphatase: 40 U/L (ref 39–117)
BUN: 14 mg/dL (ref 6–23)
Potassium: 3.8 mEq/L (ref 3.5–5.3)

## 2007-09-25 LAB — CBC WITH DIFFERENTIAL/PLATELET
BASO%: 1.5 % (ref 0.0–2.0)
Basophils Absolute: 0.1 10*3/uL (ref 0.0–0.1)
EOS%: 0.9 % (ref 0.0–7.0)
HGB: 8.6 g/dL — ABNORMAL LOW (ref 11.6–15.9)
MCH: 34.8 pg — ABNORMAL HIGH (ref 26.0–34.0)
MCHC: 34.9 g/dL (ref 32.0–36.0)
MCV: 99.8 fL (ref 81.0–101.0)
MONO%: 14 % — ABNORMAL HIGH (ref 0.0–13.0)
RBC: 2.47 10*6/uL — ABNORMAL LOW (ref 3.70–5.32)
RDW: 15.7 % — ABNORMAL HIGH (ref 11.3–14.5)

## 2007-10-06 ENCOUNTER — Ambulatory Visit: Payer: Self-pay | Admitting: Psychiatry

## 2007-10-13 ENCOUNTER — Ambulatory Visit (HOSPITAL_COMMUNITY): Admission: RE | Admit: 2007-10-13 | Discharge: 2007-10-13 | Payer: Self-pay | Admitting: Oncology

## 2007-10-13 ENCOUNTER — Ambulatory Visit: Payer: Self-pay | Admitting: Oncology

## 2007-10-15 LAB — CBC WITH DIFFERENTIAL/PLATELET
Basophils Absolute: 0 10*3/uL (ref 0.0–0.1)
EOS%: 0.5 % (ref 0.0–7.0)
Eosinophils Absolute: 0 10*3/uL (ref 0.0–0.5)
HCT: 26.2 % — ABNORMAL LOW (ref 34.8–46.6)
HGB: 9.1 g/dL — ABNORMAL LOW (ref 11.6–15.9)
MCH: 36.7 pg — ABNORMAL HIGH (ref 26.0–34.0)
MCV: 105.3 fL — ABNORMAL HIGH (ref 81.0–101.0)
NEUT#: 3 10*3/uL (ref 1.5–6.5)
NEUT%: 61.4 % (ref 39.6–76.8)
lymph#: 1.3 10*3/uL (ref 0.9–3.3)

## 2007-10-15 LAB — COMPREHENSIVE METABOLIC PANEL
AST: 27 U/L (ref 0–37)
Albumin: 4.2 g/dL (ref 3.5–5.2)
BUN: 17 mg/dL (ref 6–23)
Calcium: 9.4 mg/dL (ref 8.4–10.5)
Chloride: 102 mEq/L (ref 96–112)
Creatinine, Ser: 0.7 mg/dL (ref 0.40–1.20)
Glucose, Bld: 109 mg/dL — ABNORMAL HIGH (ref 70–99)
Potassium: 4.2 mEq/L (ref 3.5–5.3)

## 2007-10-15 LAB — CA 125: CA 125: 6.2 U/mL (ref 0.0–30.2)

## 2007-11-04 LAB — COMPREHENSIVE METABOLIC PANEL
Albumin: 4.2 g/dL (ref 3.5–5.2)
Alkaline Phosphatase: 42 U/L (ref 39–117)
BUN: 13 mg/dL (ref 6–23)
CO2: 25 mEq/L (ref 19–32)
Calcium: 9.5 mg/dL (ref 8.4–10.5)
Chloride: 103 mEq/L (ref 96–112)
Glucose, Bld: 98 mg/dL (ref 70–99)
Potassium: 3.9 mEq/L (ref 3.5–5.3)
Sodium: 139 mEq/L (ref 135–145)
Total Protein: 7.6 g/dL (ref 6.0–8.3)

## 2007-11-04 LAB — CBC WITH DIFFERENTIAL/PLATELET
Basophils Absolute: 0 10*3/uL (ref 0.0–0.1)
Eosinophils Absolute: 0 10*3/uL (ref 0.0–0.5)
HGB: 9.4 g/dL — ABNORMAL LOW (ref 11.6–15.9)
MCV: 104.4 fL — ABNORMAL HIGH (ref 81.0–101.0)
MONO#: 0.4 10*3/uL (ref 0.1–0.9)
MONO%: 12.3 % (ref 0.0–13.0)
NEUT#: 1.5 10*3/uL (ref 1.5–6.5)
RBC: 2.6 10*6/uL — ABNORMAL LOW (ref 3.70–5.32)
RDW: 15.6 % — ABNORMAL HIGH (ref 11.3–14.5)
WBC: 3.2 10*3/uL — ABNORMAL LOW (ref 3.9–10.0)
lymph#: 1.2 10*3/uL (ref 0.9–3.3)

## 2007-11-04 LAB — CA 125: CA 125: 5.1 U/mL (ref 0.0–30.2)

## 2007-11-25 ENCOUNTER — Encounter (INDEPENDENT_AMBULATORY_CARE_PROVIDER_SITE_OTHER): Payer: Self-pay | Admitting: Internal Medicine

## 2007-12-08 ENCOUNTER — Telehealth (INDEPENDENT_AMBULATORY_CARE_PROVIDER_SITE_OTHER): Payer: Self-pay | Admitting: Pharmacy Technician

## 2007-12-15 ENCOUNTER — Ambulatory Visit (HOSPITAL_COMMUNITY): Admission: RE | Admit: 2007-12-15 | Discharge: 2007-12-15 | Payer: Self-pay | Admitting: Oncology

## 2007-12-16 ENCOUNTER — Ambulatory Visit: Payer: Self-pay | Admitting: Oncology

## 2007-12-18 LAB — COMPREHENSIVE METABOLIC PANEL
Albumin: 4.2 g/dL (ref 3.5–5.2)
Alkaline Phosphatase: 46 U/L (ref 39–117)
Glucose, Bld: 86 mg/dL (ref 70–99)
Potassium: 4.1 mEq/L (ref 3.5–5.3)
Sodium: 139 mEq/L (ref 135–145)
Total Protein: 7.9 g/dL (ref 6.0–8.3)

## 2007-12-18 LAB — CBC WITH DIFFERENTIAL/PLATELET
Eosinophils Absolute: 0.1 10*3/uL (ref 0.0–0.5)
MCV: 103.3 fL — ABNORMAL HIGH (ref 81.0–101.0)
MONO#: 0.3 10*3/uL (ref 0.1–0.9)
MONO%: 9.2 % (ref 0.0–13.0)
NEUT#: 1.6 10*3/uL (ref 1.5–6.5)
RBC: 2.94 10*6/uL — ABNORMAL LOW (ref 3.70–5.32)
RDW: 15.3 % — ABNORMAL HIGH (ref 11.3–14.5)
WBC: 3.1 10*3/uL — ABNORMAL LOW (ref 3.9–10.0)

## 2008-01-27 ENCOUNTER — Ambulatory Visit: Payer: Self-pay | Admitting: Oncology

## 2008-03-05 ENCOUNTER — Ambulatory Visit: Payer: Self-pay | Admitting: Oncology

## 2008-03-08 ENCOUNTER — Telehealth (INDEPENDENT_AMBULATORY_CARE_PROVIDER_SITE_OTHER): Payer: Self-pay | Admitting: Internal Medicine

## 2008-03-09 LAB — CA 125: CA 125: 3.7 U/mL (ref 0.0–30.2)

## 2008-03-16 ENCOUNTER — Encounter (INDEPENDENT_AMBULATORY_CARE_PROVIDER_SITE_OTHER): Payer: Self-pay | Admitting: Internal Medicine

## 2008-03-17 ENCOUNTER — Ambulatory Visit: Admission: RE | Admit: 2008-03-17 | Discharge: 2008-03-17 | Payer: Self-pay | Admitting: Gynecology

## 2008-04-22 ENCOUNTER — Ambulatory Visit: Payer: Self-pay | Admitting: Oncology

## 2008-04-27 ENCOUNTER — Encounter (INDEPENDENT_AMBULATORY_CARE_PROVIDER_SITE_OTHER): Payer: Self-pay | Admitting: Internal Medicine

## 2008-04-27 ENCOUNTER — Ambulatory Visit: Payer: Self-pay | Admitting: Internal Medicine

## 2008-04-27 DIAGNOSIS — J301 Allergic rhinitis due to pollen: Secondary | ICD-10-CM | POA: Insufficient documentation

## 2008-04-28 LAB — CONVERTED CEMR LAB
AST: 85 units/L — ABNORMAL HIGH (ref 0–37)
Albumin: 4 g/dL (ref 3.5–5.2)
BUN: 12 mg/dL (ref 6–23)
Basophils Relative: 0 % (ref 0–1)
CO2: 27 meq/L (ref 19–32)
Calcium: 9.2 mg/dL (ref 8.4–10.5)
Chloride: 104 meq/L (ref 96–112)
Cholesterol: 188 mg/dL (ref 0–200)
HDL: 76 mg/dL (ref >39)
Hemoglobin: 11.7 g/dL — ABNORMAL LOW (ref 12.0–15.0)
Lymphocytes Relative: 43 % (ref 12–46)
Lymphs Abs: 2.2 10*3/uL (ref 0.7–4.0)
MCHC: 31.4 g/dL (ref 30.0–36.0)
Monocytes Relative: 9 % (ref 3–12)
Neutro Abs: 2 10*3/uL (ref 1.7–7.7)
Neutrophils Relative %: 40 % — ABNORMAL LOW (ref 43–77)
Potassium: 4 meq/L (ref 3.5–5.3)
RBC: 3.69 M/uL — ABNORMAL LOW (ref 3.87–5.11)
TSH: 1.196 microintl units/mL (ref 0.350–5.50)
WBC: 5 10*3/uL (ref 4.0–10.5)

## 2008-05-31 ENCOUNTER — Telehealth (INDEPENDENT_AMBULATORY_CARE_PROVIDER_SITE_OTHER): Payer: Self-pay | Admitting: Internal Medicine

## 2008-06-14 ENCOUNTER — Ambulatory Visit: Payer: Self-pay | Admitting: Oncology

## 2008-06-14 ENCOUNTER — Ambulatory Visit (HOSPITAL_COMMUNITY): Admission: RE | Admit: 2008-06-14 | Discharge: 2008-06-14 | Payer: Self-pay | Admitting: Oncology

## 2008-06-14 LAB — COMPREHENSIVE METABOLIC PANEL
BUN: 11 mg/dL (ref 6–23)
CO2: 27 mEq/L (ref 19–32)
Calcium: 8.8 mg/dL (ref 8.4–10.5)
Chloride: 106 mEq/L (ref 96–112)
Creatinine, Ser: 0.66 mg/dL (ref 0.40–1.20)
Glucose, Bld: 81 mg/dL (ref 70–99)

## 2008-06-14 LAB — CBC WITH DIFFERENTIAL/PLATELET
Basophils Absolute: 0 10*3/uL (ref 0.0–0.1)
HCT: 35.1 % (ref 34.8–46.6)
HGB: 12 g/dL (ref 11.6–15.9)
MONO#: 0.4 10*3/uL (ref 0.1–0.9)
NEUT%: 51 % (ref 39.6–76.8)
Platelets: 208 10*3/uL (ref 145–400)
WBC: 4.4 10*3/uL (ref 3.9–10.0)
lymph#: 1.5 10*3/uL (ref 0.9–3.3)

## 2008-06-14 LAB — CA 125: CA 125: 4.5 U/mL (ref 0.0–30.2)

## 2008-06-18 ENCOUNTER — Encounter (INDEPENDENT_AMBULATORY_CARE_PROVIDER_SITE_OTHER): Payer: Self-pay | Admitting: Internal Medicine

## 2008-06-21 ENCOUNTER — Encounter (INDEPENDENT_AMBULATORY_CARE_PROVIDER_SITE_OTHER): Payer: Self-pay | Admitting: Internal Medicine

## 2008-07-21 ENCOUNTER — Encounter: Payer: Self-pay | Admitting: Internal Medicine

## 2008-07-21 ENCOUNTER — Encounter: Admission: RE | Admit: 2008-07-21 | Discharge: 2008-07-21 | Payer: Self-pay | Admitting: Oncology

## 2008-09-16 ENCOUNTER — Ambulatory Visit: Payer: Self-pay | Admitting: Oncology

## 2008-09-27 ENCOUNTER — Encounter (INDEPENDENT_AMBULATORY_CARE_PROVIDER_SITE_OTHER): Payer: Self-pay | Admitting: Internal Medicine

## 2008-11-08 ENCOUNTER — Ambulatory Visit: Payer: Self-pay | Admitting: Oncology

## 2008-11-15 ENCOUNTER — Encounter (INDEPENDENT_AMBULATORY_CARE_PROVIDER_SITE_OTHER): Payer: Self-pay | Admitting: Internal Medicine

## 2008-11-22 ENCOUNTER — Telehealth (INDEPENDENT_AMBULATORY_CARE_PROVIDER_SITE_OTHER): Payer: Self-pay | Admitting: Internal Medicine

## 2008-12-16 ENCOUNTER — Ambulatory Visit: Payer: Self-pay | Admitting: Oncology

## 2009-01-14 ENCOUNTER — Telehealth (INDEPENDENT_AMBULATORY_CARE_PROVIDER_SITE_OTHER): Payer: Self-pay | Admitting: Pharmacy Technician

## 2009-01-26 ENCOUNTER — Encounter (INDEPENDENT_AMBULATORY_CARE_PROVIDER_SITE_OTHER): Payer: Self-pay | Admitting: Internal Medicine

## 2009-01-28 ENCOUNTER — Telehealth (INDEPENDENT_AMBULATORY_CARE_PROVIDER_SITE_OTHER): Payer: Self-pay | Admitting: Internal Medicine

## 2009-02-15 ENCOUNTER — Encounter (INDEPENDENT_AMBULATORY_CARE_PROVIDER_SITE_OTHER): Payer: Self-pay | Admitting: Internal Medicine

## 2009-03-03 ENCOUNTER — Encounter (INDEPENDENT_AMBULATORY_CARE_PROVIDER_SITE_OTHER): Payer: Self-pay | Admitting: Internal Medicine

## 2009-04-27 ENCOUNTER — Telehealth (INDEPENDENT_AMBULATORY_CARE_PROVIDER_SITE_OTHER): Payer: Self-pay | Admitting: Internal Medicine

## 2009-08-08 ENCOUNTER — Encounter: Payer: Self-pay | Admitting: Internal Medicine

## 2009-09-22 ENCOUNTER — Encounter: Payer: Self-pay | Admitting: Internal Medicine

## 2009-12-19 ENCOUNTER — Telehealth: Payer: Self-pay | Admitting: Internal Medicine

## 2010-01-03 ENCOUNTER — Encounter: Payer: Self-pay | Admitting: Internal Medicine

## 2010-05-18 ENCOUNTER — Encounter: Payer: Self-pay | Admitting: Internal Medicine

## 2010-06-22 ENCOUNTER — Encounter: Payer: Self-pay | Admitting: Internal Medicine

## 2010-12-03 ENCOUNTER — Encounter: Payer: Self-pay | Admitting: Oncology

## 2010-12-04 ENCOUNTER — Encounter: Payer: Self-pay | Admitting: Internal Medicine

## 2010-12-12 NOTE — Consult Note (Signed)
Summary: [facility]   Imported By: Louretta Parma 05/25/2010 15:44:34  _____________________________________________________________________  External Attachment:    Type:   Image     Comment:   External Document

## 2010-12-12 NOTE — Consult Note (Signed)
Summary: [facility]: Rheumatology  [facility]: Rheumatology   Imported By: Florinda Marker 01/12/2010 09:12:51  _____________________________________________________________________  External Attachment:    Type:   Image     Comment:   External Document

## 2010-12-12 NOTE — Progress Notes (Signed)
Summary: med refil/gp  Phone Note Refill Request Message from:  Fax from Pharmacy on December 19, 2009 12:08 PM  Refills Requested: Medication #1:  FOSAMAX 70 MG TABS Take 1 tablet by mouth every week on Friday.   Last Refilled: 11/02/2009  Method Requested: Electronic Initial call taken by: Chinita Pester RN,  December 19, 2009 12:10 PM  Follow-up for Phone Call        completed Follow-up by: Mliss Sax MD,  December 19, 2009 12:59 PM    Prescriptions: FOSAMAX 70 MG TABS (ALENDRONATE SODIUM) Take 1 tablet by mouth every week on Friday.  #4 x 11   Entered and Authorized by:   Mliss Sax MD   Signed by:   Mliss Sax MD on 12/19/2009   Method used:   Electronically to        [facility]* (retail)       [address] [place], [place]  [address]       Ph: [phone]       Fax: [phone]   RxID:   [id number]

## 2010-12-12 NOTE — Consult Note (Signed)
Summary: [facility]  [facility]   Imported By: Margie Billet 07/12/2010 10:48:23  _____________________________________________________________________  External Attachment:    Type:   Image     Comment:   External Document

## 2011-03-27 NOTE — Consult Note (Signed)
[name], [name]              ACCOUNT NO.:  [id number]   MEDICAL RECORD NO.:  [id number]          PATIENT TYPE:  OUT   LOCATION:  GYN                          FACILITY:  [facility]   PHYSICIAN:  De Blanch, M.D.DATE OF BIRTH:  [birth year]   DATE OF CONSULTATION:  03/17/2008  DATE OF DISCHARGE:                                 CONSULTATION   CHIEF COMPLAINT:  Recurrent ovarian cancer.   INTERVAL HISTORY:  A 50 year old white female returns for continuing  followup, having recently completed eight cycles of carboplatin and  Taxol for treatment of recurrent ovarian cancer.  This is the second  round of treatment.  During that time, her CA-125 plummeted, and most  recently on April 28 was 4 U/ml.  She had, at the completion of her  salvage chemotherapy, a CT scan obtained on February 2nd, and was  normal.  Since discontinuing chemotherapy, the patient's arthritis has  become more problematic, and she has recently seen her rheumatologist at  [facility].  Methotrexate was considered as well as Rituxan.  It is noted that  the patient's liver function tests obtained recently show an elevated  AST and ALT.  She has had similar problems off and on throughout her  chemotherapy.  Otherwise, she denies any GI or GU symptoms, has no  pelvic pain or pressure, vaginal bleeding or discharge.   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  She had  optimal surgical debulking with only residual disease being on the small  nodules on the diaphragm.  She was initially treated with six cycles of  carboplatin and Taxol, completed in November, 2006.  At that time, her  CT scan and CA-125 were normal.  She was then followed with findings of  ascites and enlarging left common iliac lymph node.  Because of her long  platin-free intervals, she was retreated with carboplatin and Taxol but  for eight total cycles.   PAST  MEDICAL HISTORY/MEDICAL ILLNESSES:  Rheumatoid arthritis.  This has  previously been treated with prednisone and Rituxan infusions.   PAST SURGICAL HISTORY:  TAH/BSO, radical dissection and resection for  ovarian cancer, June, 2006.   CURRENT MEDICATIONS:  Ibuprofen.   DRUG ALLERGIES:  None.   FAMILY HISTORY:  Paternal aunt with breast cancer.  Paternal aunt with  ovarian cancer.  Paternal grandfather with prostate cancer.   REVIEW OF SYSTEMS:  A 10-point comprehensive review of systems is  negative, except as noted above.   PHYSICAL EXAMINATION:  Weight 134 pounds.  Blood pressure 110/70, pulse  80, respiratory rate 20.  GENERAL:  Patient is a healthy white female in no acute distress.  HEENT:  Negative.  NECK:  Supple without thyromegaly.  There is no supraclavicular or  inguinal adenopathy.  ABDOMEN:  Soft and nontender.  No mass, organomegaly, ascites, or  hernias are noted.  PELVIC:  EG/BUS, vagina, bladder, and urethra are normal.  The cervix  and uterus are surgically absent.  Adnexa without masses.  Rectovaginal  exam confirms.  EXTREMITIES:  Lower extremities are  without edema and varicosities.   IMPRESSION:  Recurrent ovarian cancer, currently in remission.   We will continue to monitor the patient closely.  She will return to see  Dr. Clelia Croft in three months and return to see [place] in six months.  We hope  to continue to obtain CA-125's at every visit.   With regard to her elevated liver function tests, I have reviewed them  with Dr. Clelia Croft.  We agree it would not be appropriate to treat the  patient with methotrexate at the present time; however, Dr. Clelia Croft did  not have any hesitation for her to be treated with Rituxan.      De Blanch, M.D.  Electronically Signed     DC/MEDQ  D:  03/17/2008  T:  03/17/2008  Job:  [id number]   cc:   Blenda Nicely. [place]  Fax: [phone]   Telford Nab, R.N.  [address]  [place], [place] [address]   Dr. Lorre Munroe, [facility]  Dept of Rheumatology

## 2011-03-27 NOTE — Consult Note (Signed)
[name], [name]              ACCOUNT NO.:  [id number]   MEDICAL RECORD NO.:  [id number]          PATIENT TYPE:  OUT   LOCATION:  GYN                          FACILITY:  [facility]   PHYSICIAN:  De Blanch, M.D.DATE OF BIRTH:  04/25/1961   DATE OF CONSULTATION:  DATE OF DISCHARGE:                                 CONSULTATION   CHIEF COMPLAINT:  Ovarian cancer.   INTERVAL HISTORY:  The patient returns today for re-evaluation of a  number of studies recently performed.  At our last visit in June, it was  recognized she had a subcutaneous upper abdominal wall nodule which has  now undergone fine needle aspiration.  Final pathology shows this to be  a poorly differentiated carcinoma which is consistent with her known  ovarian primary cancer.  Subsequently, she had a CT scan of the chest,  abdomen, and pelvis.  She has apparently new disease in a fluid  collection in the right upper quadrant, increased pelvic fluid, and an  increase in her left common iliac lymph node measuring 8-mm.  The  patient herself notes that she has an increasing right axillary lymph  node and has had increasing abdominal symptoms, especially poor  appetite.  She denies any cramping or any nausea or vomiting.   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  She  had optimal surgical debulking with the only residual disease being  small nodules on the right diaphragm.  She received 6 cycles of  carboplatin and Taxol chemotherapy which she completed in November 2006.  Preoperative CA-125 was 3,010 and fell to the normal range very promptly  after surgery and chemotherapy.  She has been followed subsequently with  serial CA-125 which have been normal but slightly increasing.  The last  CA-125 was May 05, 2007, measuring 20 units/ml.   PAST MEDICAL HISTORY:   MEDICAL ILLNESSES:  Rheumatoid arthritis, treated  with prednisone and  Rituxan infusions.   PAST SURGICAL HISTORY:  1. TAH-BSO.  2. Radical resection of ovarian cancer, June 2006.   CURRENT MEDICATIONS:  Ibuprofen, prednisone.   DRUG ALLERGIES:  None.   FAMILY HISTORY:  Paternal aunt with breast cancer, another paternal aunt  with ovarian cancer, and a paternal grandfather with prostate cancer.   REVIEW OF SYSTEMS:  A 10-point comprehensive review of systems is  negative except as noted as above.   PHYSICAL EXAMINATION:  VITAL SIGNS:  Weight 130 pounds, blood pressure  108/78, pulse 72.  GENERAL:  The patient is a slender healthy white female in no acute  distress.  HEENT:  Negative.  NECK:  Supple without thyromegaly.  There is a 2-cm mobile right  axillary lymph node.  ABDOMEN:  Soft and nontender.  She does have a palpable mass  approximately 3-to-4-cm superior to her umbilicus.  The abdomen is  otherwise soft and nontender.  No other masses, organomegaly, ascites,  or hernias are noted.  PELVIC:  Deferred as it was done just a month ago.   IMPRESSION:  Recurrent ovarian cancer, biopsy-proven and  also with  evidence of increasing intraperitoneal and retroperitoneal disease.   The patient has been made aware of all these findings.  And, she  understands and accepts the fact that she does have recurrent ovarian  cancer.   With regard to treatment options, I do not see that there is a  significant role for surgical debulking and would therefore recommend  she reinstitute systemic cytotoxic chemotherapy.  Given the fact that  she has had a greater than 42-month platin-free interval, I would suggest  she return to a platin regimen.  And given that she did well with  carboplatin and Taxol in her first regimen, I would reinstitute  carboplatin and Taxol once again.  She does not have any significant  peripheral neuropathy.  She is given an appointment to see her medical  oncologist, Dr. Clelia Croft, early next week and he will  initiate therapy  shortly thereafter.   In addition, I think it would be reasonable at some point in time to  have the patient see a genetic counselor in consultation given her young  age at diagnosis as well as her family history noted above.   She will return to see me after 2-3 cycles of chemotherapy.      De Blanch, M.D.  Electronically Signed     DC/MEDQ  D:  06/06/2007  T:  06/06/2007  Job:  [id number]   cc:   Telford Nab, R.N.  [address]  [place], [place] [address]   Blenda Nicely. [employer]

## 2011-03-27 NOTE — Consult Note (Signed)
[name], [name]              ACCOUNT NO.:  [id number]   MEDICAL RECORD NO.:  [id number]          PATIENT TYPE:  OUT   LOCATION:  GYN                          FACILITY:  [facility]   PHYSICIAN:  De Blanch, M.D.DATE OF BIRTH:  [birth year]   DATE OF CONSULTATION:  05/09/2007  DATE OF DISCHARGE:                                 CONSULTATION   GYN ONCOLOGY CLINIC.   CHIEF COMPLAINT:  Ovarian cancer.   INTERVAL HISTORY:  Patient here today for continuing followup of ovarian  cancer.  Since her last visit her only concern has been the recognition  of a subcutaneous nodule superior to her umbilicus.   She denies any GI or GU symptoms, has no pelvic pain, pressure, vaginal  bleeding or discharge.  She saw Dr. Clelia Croft in late March and in early  April had a CT scan of the chest, abdomen and pelvis which was negative  for any evidence of disease, although she did have pericardial  effusions.   The patient reports that she has had recently to discontinue the use of  methotrexate because of abnormal liver function tests.  She continues  taking prednisone.  She also received Rituxan in April and receives it  approximately every 6 months.   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  She had  optimal surgical debulking with the only residual disease being small  nodules on the right diaphragm.  She was then treated with 6 cycles of  carboplatin and Taxol chemotherapy which was completed in November of  2006.  Preoperative CA-125 was 3010 and fell to normal range very  promptly after surgery and chemotherapy.  She recently had a CA-125  obtained on June 23rd which was 20.4 units/mL (previously it was 7.6 and  5.2 units/mL).   PAST MEDICAL HISTORY:  Rheumatoid arthritis currently being treated with  prednisone and Rituxan infusions.   PAST SURGICAL HISTORY:  1. TAH.  2. BSO.  3. Radical  resection of ovarian cancer June, 2006.   CURRENT MEDICATIONS:  1. Ibuprofen.  2. Prednisone.   DRUG ALLERGIES:  None.   FAMILY HISTORY:  1. Paternal aunt with breast cancer.  2. Another paternal aunt with ovarian cancer.  3. Paternal grandfather prostate cancer.   REVIEW OF SYSTEMS:  A 10-point comprehensive review of systems negative  except as noted above.   PHYSICAL EXAM:  Weight 132 pounds, blood pressure 110/70, pulse 80,  respiratory rate 20.  GENERAL:  The patient is a slender, healthy white female in no acute  distress.  HEENT:  Negative.  NECK:  Supple without thyromegaly.  There is no supraclavicular or  inguinal adenopathy.  ABDOMEN:  Soft and nontender.  There is a 5 mm subcutaneous nodule  directly superior to the umbilicus from approximately 3 cm cephalad  which is mobile, nontender.  Midline incision itself is well healed, no  hernias are noted.  PELVIC EXAM:  EG/BUS, vagina, bladder, urethra are normal.  BIMANUAL EXAMINATION:  There is thickening of the vaginal cuff without  any discrete  masses or nodularity.  RECTOVAGINAL EXAM:  Confirms.  LOWER EXTREMITIES:  Without edema or varicosities.   IMPRESSION:  1. Stage III ovarian cancer with a slightly elevated CA-125 although      still in the normal range.  2. Subcutaneous nodule which is concerning to the patient.  I informed      that it was an unlikely site for recurrence of ovarian cancer, but      nonetheless we should investigate it further.   PLAN:  1. We will arrange for a fine needle aspirate of the mass.  2. We will repeat her CA-125 in 2 months.  Certainly the elevation      could be associated with her rheumatoid arthritis or Rituxan.      De Blanch, M.D.  Electronically Signed     DC/MEDQ  D:  05/09/2007  T:  05/09/2007  Job:  [id number]

## 2011-03-30 NOTE — Group Therapy Note (Signed)
NAME:  [name], [name] NO.:  [id number]   MEDICAL RECORD NO.:  [id number]          PATIENT TYPE:  WOC   LOCATION:  [facility]                   FACILITY:  [facility]   PHYSICIAN:  Argentina Donovan, MD        DATE OF BIRTH:  [birth year]   DATE OF SERVICE:                                    CLINIC NOTE   This patient is a 50 year old white female, gravida 2, para 2, 0-0-2, who  was seen in the emergency room at [facility] and referred over to [place] after  they got an ultrasound, for a right ovarian complex cyst measuring 16 cm in  diameter, which they stated was worrisome for malignancy.  The patient just  recently noticed some swelling in her abdomen and increased pressure.  She  said she feels like she is going to burst.   Examination of the abdomen is soft with an obvious mass, especially on the  right side.  It is somewhat mobile, does not seem to be adherent to the  underling tissues and can be palpated past the midline.  There is no  significant abdominal pain, no rebound, no guarding.  On genital exam,  external genitalia is normal,  BUS within normal limits.  Vagina is clean  and well rugated.  Cervix is clean and parous.  Pap smear was obtained.  The  uterus could not be well outlined, separate from the mass, although on  ultrasound it looked fairly normal.   I discussed with the patient and my plan is to refer her to the GYN  oncologists for surgery.  CA 125 will be drawn today and Pap smear.   IMPRESSION:  Large complex right ovarian mass.       PR/MEDQ  D:  04/26/2005  T:  04/27/2005  Job:  [id number]

## 2011-03-30 NOTE — Discharge Summary (Signed)
[name], [name]              ACCOUNT NO.:  [id number]   MEDICAL RECORD NO.:  [id number]            PATIENT TYPE:   LOCATION:                                 FACILITY:   PHYSICIAN:  Hollace Hayward, M.D.        DATE OF BIRTH:   DATE OF ADMISSION:  04/22/2006  DATE OF DISCHARGE:  04/23/2006                               DISCHARGE SUMMARY   __________ __________ __________   DISCHARGE DIAGNOSES:  1. Cellulitis secondary to laceration from her dog.  2. Ovarian cancer, stage III.  3. Rheumatoid arthritis.  4. Anemia.   DISCHARGE MEDICATIONS:  1. Prednisone 10 mg daily.  2. Folic acid 1 mg daily.  3. Premarin 0.625 mg daily.  4. Methotrexate 4 tablets daily.  5. One multivitamin daily.  6. Amoxicillin 500 mg t.i.d. starting on June 16 after finishing her      Augmentin.  7. Augmentin 500 mg t.i.d. for 3 days, after which start taking      amoxicillin.   FOLLOWUP:  She is to follow up with Dr. Caro Koch on May 01, 2006, at 2:30  p.m.  She was asked to follow up with her oncologist who is treating her  ovarian cancer.   CONSULTATIONS:  Ortho:  No surgery required, just antibiotic therapy.   HISTORY AND PHYSICAL:  At the time of admission, [name] is a 50-year-  old white female who was playing with her dogs prior to admission and  suffered a scratch and presented to Urgent Care prior to coming to the  ER and was prescribed Keflex and had the wound sutured.  She then  developed marked edema and erythema at the site and presented to the  emergency department.  She came in without fever, chills, or systemic  symptoms.   PHYSICAL EXAMINATION:  VITAL SIGNS:  At the time of admission,  temperature was 97.5, BP 113/82, pulse rate 67, respiratory rate was 18,  O2 sat was 98% on room air.  GENERAL:  She was noted to be distressed.  HEENT:  Pupils equal, round, reactive.  LUNGS:  Clear to auscultation bilaterally.  HEART:  Regular rate and rhythm, no murmur.  ABDOMEN:  Soft,  nontender with positive bowel sounds.  EXTREMITIES:  A linear laceration on the left hand on the dorsum.  No  pus, no blood.  There was edema and swelling distally at the dorsum of  the hand.  Bilateral joint swelling secondary to rheumatoid arthritis  with mild increased warmth.  Neurologically nonfocal.   At the time of admission, white count 6.3, hemoglobin 10.8, platelets of  264.  INR of 1.0, PT of 13.1 and PTT of 34.   HOSPITAL COURSE:  Cellulitis:  Was treated with IV antibiotics and then  she was discharged home with Augmentin with a continued dose of  antibiotics with amoxicillin.  She is to follow up with Dr. Caro Koch at the  outpatient clinic.  During the hospitalization, she did have an ortho  consult which was done by Dr. Lajoyce Corners who recommended no surgical therapy  and just p.o. antibiotics.  She had an uneventful hospitalization.      Hollace Hayward, M.D.  Electronically Signed     TE/MEDQ  D:  10/26/2006  T:  10/27/2006  Job:  [id number]

## 2011-08-28 LAB — CBC
HCT: 31.6 — ABNORMAL LOW
Hemoglobin: 11 — ABNORMAL LOW
MCHC: 34.9
RDW: 13.4

## 2011-08-28 LAB — DIFFERENTIAL
Basophils Absolute: 0
Eosinophils Relative: 4
Lymphocytes Relative: 39
Monocytes Absolute: 0.5

## 2012-03-14 NOTE — Progress Notes (Signed)
Received progress notes from Dr. Stanford Breed @ [facility]; forwarded to Dr. Clelia Croft.

## 2013-10-30 ENCOUNTER — Telehealth (HOSPITAL_COMMUNITY): Payer: Self-pay | Admitting: *Deleted

## 2013-11-02 ENCOUNTER — Other Ambulatory Visit (HOSPITAL_COMMUNITY): Payer: Self-pay | Admitting: Family Medicine

## 2013-11-02 DIAGNOSIS — R600 Localized edema: Secondary | ICD-10-CM

## 2013-11-09 ENCOUNTER — Ambulatory Visit (INDEPENDENT_AMBULATORY_CARE_PROVIDER_SITE_OTHER): Payer: Medicaid Other | Admitting: Neurology

## 2013-11-09 ENCOUNTER — Ambulatory Visit (HOSPITAL_COMMUNITY)
Admission: RE | Admit: 2013-11-09 | Discharge: 2013-11-09 | Disposition: A | Discharge status: Home / Self Care | Payer: Medicaid Other | Source: Ambulatory Visit | Attending: Cardiovascular Disease | Admitting: Cardiovascular Disease

## 2013-11-09 ENCOUNTER — Encounter: Payer: Self-pay | Admitting: Neurology

## 2013-11-09 ENCOUNTER — Encounter (INDEPENDENT_AMBULATORY_CARE_PROVIDER_SITE_OTHER): Payer: Self-pay

## 2013-11-09 VITALS — BP 146/93 | HR 76 | Ht 68.5 in | Wt 143.0 lb

## 2013-11-09 DIAGNOSIS — R209 Unspecified disturbances of skin sensation: Secondary | ICD-10-CM

## 2013-11-09 DIAGNOSIS — R202 Paresthesia of skin: Secondary | ICD-10-CM

## 2013-11-09 DIAGNOSIS — I059 Rheumatic mitral valve disease, unspecified: Secondary | ICD-10-CM | POA: Insufficient documentation

## 2013-11-09 DIAGNOSIS — C569 Malignant neoplasm of unspecified ovary: Secondary | ICD-10-CM | POA: Insufficient documentation

## 2013-11-09 DIAGNOSIS — R600 Localized edema: Secondary | ICD-10-CM

## 2013-11-09 DIAGNOSIS — R609 Edema, unspecified: Secondary | ICD-10-CM

## 2013-11-09 NOTE — Patient Instructions (Signed)
Overall you are doing fairly well but I do want to suggest a few things today:   Remember to drink plenty of fluid, eat healthy meals and do not skip any meals. Try to eat protein with a every meal and eat a healthy snack such as fruit or nuts in between meals. Try to keep a regular sleep-wake schedule and try to exercise daily, particularly in the form of walking, 20-30 minutes a day, if you can.   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   As far as your medications are concerned, I would like to suggest continuing on the Gabapentin at your current dose and schedule. We could consider switching to Lyrica or increasing the gabapentin dose in the future.   As far as diagnostic testing:  1)MRI of the cervical spine  Please call us with any interim questions, concerns, problems, updates or refill requests.   My clinical assistant and will answer any of your questions and relay your messages to me and also relay most of my messages to you.   Our phone number is [phone]. We also have an after hours call service for urgent matters and there is a physician on-call for urgent questions. For any emergencies you know to call 911 or go to the nearest emergency room

## 2013-11-09 NOTE — Progress Notes (Signed)
[facility]    Provider:  Dr Hosie Poisson Referring Provider: Lorenso Koch Nicole Quan, MD Primary Care Physician:  [name]  CC: numbness and tingling in hands and feet  HPI:  [name] is a 52 y.o. female here as a referral from Dr. Lorenso Koch for evaluation of peripheral neuropathy  Sensory changes began after last round of chemotherapy (August 2014). Patient notes paresthesias(pins and needles sensation) in bilateral feet when she flexes her neck and in hands when she straightens her arms. Also notes loss of sensation in tips of fingers and soles of her feet. Involves all finger tips. Not progressing proximally. Notices some difficulty walking and trouble with fine motor tasks using her hands. Has dropped things due to lack of sensation.    When she flexes her neck she does not get radiating down the whole spine, it only involves the feet. (Does not appear to be true Lhermitte's sign).  No loss of bowel/bladder control. No history of cervical neck pain, trauma. Has history of RA.   Diagnosed with ovarian CA in June 2006. Last chemotherapy was in August 2014, was taking cisplastin and avastin. Currently taking a break from chemotherapy, scheduled for repeat scan on January 6th and will make decision at that time. No RT in the past.   Currently taking Gabapentin 1200mg  daily, tolerating well, minimal benefit noted. Had vitamin B12 and thyroid levels checked her primary care physician, per patient were unremarkable.  Review of Systems: Out of a complete 14 system review, the patient complains of only the following symptoms, and all other reviewed systems are negative. Positive easy bruising easy bleeding lymph nodes [name] denies palpitations fatigue weight gain numbness  History   Social History  . Marital Status: Married    Spouse Name: [name]    Number of Children: 2  . Years of Education: 13   Occupational History  . Not on file.   Social History Main  Topics  . Smoking status: Never Smoker   . Smokeless tobacco: Never Used  . Alcohol Use: No  . Drug Use: No  . Sexual Activity: Not on file   Other Topics Concern  . Not on file   Social History Narrative   Patient is married [name]) and lives with her husband and her daughter.   Patient has two children.   Patient works at [employer].   Patient has a college education.   Patient is right handed   Patient drinks two cups of coffee daily.    No family history on file.  Past Medical History  Diagnosis Date  . Ovarian cancer   . RA (rheumatoid arthritis)     Past Surgical History  Procedure Laterality Date  . Vaginal hysterectomy      Current Outpatient Prescriptions  Medication Sig Dispense Refill  . ALENDRONATE SODIUM PO Take by mouth.      Marland Kitchen BIOTIN PO Take by mouth.      . gabapentin (NEURONTIN) 300 MG capsule Take 300 mg by mouth 3 (three) times daily.      . MULTIPLE VITAMIN PO Take by mouth.      . prednisoLONE 5 MG TABS tablet Take 5 mg by mouth.      . Bevacizumab (AVASTIN IV) Inject into the vein.      Marland Kitchen gemcitabine (GEMZAR) 1 G injection Inject into the vein once.       No current facility-administered medications for this visit.    Allergies as of 11/09/2013 - Review Complete  11/09/2013  Allergen Reaction Noted  . Lisinopril Cough 11/09/2013    Vitals: BP 146/93  Pulse 76  Ht 5' 8.5" (1.74 m)  Wt 143 lb (64.864 kg)  BMI 21.42 kg/m2 Last Weight:  Wt Readings from Last 1 Encounters:  11/09/13 143 lb (64.864 kg)   Last Height:   Ht Readings from Last 1 Encounters:  11/09/13 5' 8.5" (1.74 m)     Physical exam: Exam: Gen: NAD, conversant Eyes: anicteric sclerae, moist conjunctivae HENT: Atraumatic, oropharynx clear Neck: Trachea midline; supple,  Lungs: CTA, no wheezing, rales, rhonic                          CV: RRR, no MRG Abdomen: Soft, non-tender;  Extremities: No peripheral edema  Skin: Normal temperature, no rash,  Psych:  Appropriate affect, pleasant  Neuro: MS: AA&Ox3, appropriately interactive, normal affect   Speech: fluent w/o paraphasic error  Memory: good recent and remote recall  CN: PERRL, EOMI no nystagmus, no ptosis, sensation intact to LT V1-V3 bilat, face symmetric, no weakness, hearing grossly intact, palate elevates symmetrically, shoulder shrug 5/5 bilat,  tongue protrudes midline, no fasiculations noted.  Motor: Mild atrophy left thenar eminence otherwise unremarkable Strength: 5/5  In all extremities  Coord: rapid alternating and point-to-point (FNF, HTS) movements intact.  Reflexes: Diminished biceps triceps brachioradialis bilaterally, 1+ patellar, absent Achilles bilaterally, equivocal plantar reflex bilaterally   Sens: Diminished light touch and tips of all fingers otherwise unremarkable sensory exam in upper extremity. Decreased light touch, pinprick, temperature, vibration in bilateral lower extremities. Vibration decreased to knee level bilaterally.  Gait: Negative Rhomberg   Assessment:  After physical and neurologic examination, review of laboratory studies, imaging, neurophysiology testing and pre-existing records, assessment will be reviewed on the problem list.  Plan:  Treatment plan and additional workup will be reviewed under Problem List.  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 Patient notes 2 different concerns, a loss of sensation in tips of fingers and soles of feet and a sensation of pins and needles in her feet with flexion of neck and extension of arms. Suspect loss of sensation is likely related to chemotherapy. Unclear etiology of sensation associated with neck flexion/arm extension. Question if this represents a Lhermitte's sign though would be an atypical presentation. With history of rheumatoid  arthritis would need to rule out cervical degenerative changes though otherwise unremarkable physical exam makes this less likely. Will order MRI cervical spine. Can consider EMG/NCS in the future though suspect will have low yield. Discussed different therapeutic options with patient. She wishes to stay at current dose of Gabapentin for now. In the future can consider increase of gabapentin or switch to lyrica. Follow up once MRI completed.     Elspeth Cho, DO  [facility] [address] [place], [place] [address]  Phone [phone] Fax [phone]

## 2013-11-09 NOTE — Progress Notes (Signed)
2D Echo Performed 11/09/2013    Valton Schwartz, RCS  

## 2013-11-24 ENCOUNTER — Ambulatory Visit
Admission: RE | Admit: 2013-11-24 | Discharge: 2013-11-24 | Disposition: A | Discharge status: Home / Self Care | Payer: Medicaid Other | Source: Ambulatory Visit | Attending: Neurology | Admitting: Neurology

## 2013-11-24 DIAGNOSIS — R209 Unspecified disturbances of skin sensation: Secondary | ICD-10-CM

## 2013-11-24 DIAGNOSIS — R202 Paresthesia of skin: Secondary | ICD-10-CM

## 2014-03-18 ENCOUNTER — Emergency Department (HOSPITAL_COMMUNITY)
Admission: EM | Admit: 2014-03-18 | Discharge: 2014-03-18 | Disposition: A | Discharge status: Home / Self Care | Payer: Medicaid Other | Attending: Emergency Medicine | Admitting: Emergency Medicine

## 2014-03-18 ENCOUNTER — Emergency Department (HOSPITAL_COMMUNITY): Payer: Medicaid Other

## 2014-03-18 ENCOUNTER — Encounter (HOSPITAL_COMMUNITY): Payer: Self-pay | Admitting: Emergency Medicine

## 2014-03-18 DIAGNOSIS — R1084 Generalized abdominal pain: Secondary | ICD-10-CM | POA: Insufficient documentation

## 2014-03-18 DIAGNOSIS — Z90710 Acquired absence of both cervix and uterus: Secondary | ICD-10-CM | POA: Insufficient documentation

## 2014-03-18 DIAGNOSIS — Z8543 Personal history of malignant neoplasm of ovary: Secondary | ICD-10-CM | POA: Insufficient documentation

## 2014-03-18 DIAGNOSIS — IMO0002 Reserved for concepts with insufficient information to code with codable children: Secondary | ICD-10-CM | POA: Insufficient documentation

## 2014-03-18 DIAGNOSIS — Z9889 Other specified postprocedural states: Secondary | ICD-10-CM | POA: Insufficient documentation

## 2014-03-18 DIAGNOSIS — Z79899 Other long term (current) drug therapy: Secondary | ICD-10-CM | POA: Insufficient documentation

## 2014-03-18 DIAGNOSIS — M069 Rheumatoid arthritis, unspecified: Secondary | ICD-10-CM | POA: Insufficient documentation

## 2014-03-18 DIAGNOSIS — R109 Unspecified abdominal pain: Secondary | ICD-10-CM

## 2014-03-18 DIAGNOSIS — Z791 Long term (current) use of non-steroidal anti-inflammatories (NSAID): Secondary | ICD-10-CM | POA: Insufficient documentation

## 2014-03-18 LAB — CBC WITH DIFFERENTIAL/PLATELET
BASOS ABS: 0 10*3/uL (ref 0.0–0.1)
Basophils Relative: 1 % (ref 0–1)
EOS PCT: 2 % (ref 0–5)
Eosinophils Absolute: 0.1 10*3/uL (ref 0.0–0.7)
HCT: 30.5 % — ABNORMAL LOW (ref 36.0–46.0)
Hemoglobin: 9.9 g/dL — ABNORMAL LOW (ref 12.0–15.0)
LYMPHS PCT: 13 % (ref 12–46)
Lymphs Abs: 0.8 10*3/uL (ref 0.7–4.0)
MCH: 29.9 pg (ref 26.0–34.0)
MCHC: 32.5 g/dL (ref 30.0–36.0)
MCV: 92.1 fL (ref 78.0–100.0)
Monocytes Absolute: 0.4 10*3/uL (ref 0.1–1.0)
Monocytes Relative: 7 % (ref 3–12)
NEUTROS ABS: 4.6 10*3/uL (ref 1.7–7.7)
Neutrophils Relative %: 77 % (ref 43–77)
PLATELETS: 245 10*3/uL (ref 150–400)
RBC: 3.31 MIL/uL — ABNORMAL LOW (ref 3.87–5.11)
RDW: 14.9 % (ref 11.5–15.5)
WBC: 6 10*3/uL (ref 4.0–10.5)

## 2014-03-18 LAB — URINALYSIS, ROUTINE W REFLEX MICROSCOPIC
Bilirubin Urine: NEGATIVE
Glucose, UA: NEGATIVE mg/dL
Hgb urine dipstick: NEGATIVE
Ketones, ur: NEGATIVE mg/dL
LEUKOCYTES UA: NEGATIVE
NITRITE: NEGATIVE
PROTEIN: NEGATIVE mg/dL
Specific Gravity, Urine: 1.021 (ref 1.005–1.030)
Urobilinogen, UA: 0.2 mg/dL (ref 0.0–1.0)
pH: 6 (ref 5.0–8.0)

## 2014-03-18 LAB — COMPREHENSIVE METABOLIC PANEL
ALT: 66 U/L — AB (ref 0–35)
AST: 99 U/L — AB (ref 0–37)
Albumin: 3.3 g/dL — ABNORMAL LOW (ref 3.5–5.2)
Alkaline Phosphatase: 52 U/L (ref 39–117)
BUN: 17 mg/dL (ref 6–23)
CALCIUM: 8.7 mg/dL (ref 8.4–10.5)
CHLORIDE: 94 meq/L — AB (ref 96–112)
CO2: 32 meq/L (ref 19–32)
Creatinine, Ser: 0.64 mg/dL (ref 0.50–1.10)
GFR calc Af Amer: >90 mL/min (ref >90)
Glucose, Bld: 129 mg/dL — ABNORMAL HIGH (ref 70–99)
Potassium: 2.8 mEq/L — CL (ref 3.7–5.3)
Sodium: 136 mEq/L — ABNORMAL LOW (ref 137–147)
Total Bilirubin: <0.2 mg/dL — ABNORMAL LOW (ref 0.3–1.2)
Total Protein: 7.1 g/dL (ref 6.0–8.3)

## 2014-03-18 MED ORDER — MORPHINE SULFATE 4 MG/ML IJ SOLN
4.0000 mg | Freq: Once | INTRAMUSCULAR | Status: AC
  Administered 2014-03-18: 4 mg via INTRAVENOUS
  Filled 2014-03-18: qty 1

## 2014-03-18 MED ORDER — HEPARIN SOD (PORK) LOCK FLUSH 100 UNIT/ML IV SOLN
500.0000 [IU] | Freq: Once | INTRAVENOUS | Status: DC
  Filled 2014-03-18: qty 5

## 2014-03-18 MED ORDER — HYDROCODONE-ACETAMINOPHEN 5-325 MG PO TABS: 1.0000 | ORAL_TABLET | ORAL | Status: AC | PRN

## 2014-03-18 MED ORDER — POTASSIUM CHLORIDE CRYS ER 20 MEQ PO TBCR
40.0000 meq | EXTENDED_RELEASE_TABLET | Freq: Once | ORAL | Status: AC
  Administered 2014-03-18: 40 meq via ORAL
  Filled 2014-03-18: qty 2

## 2014-03-18 MED ORDER — POTASSIUM CHLORIDE 10 MEQ/100ML IV SOLN
10.0000 meq | Freq: Once | INTRAVENOUS | Status: AC
  Administered 2014-03-18: 10 meq via INTRAVENOUS
  Filled 2014-03-18: qty 100

## 2014-03-18 MED ORDER — IOHEXOL 300 MG/ML  SOLN
100.0000 mL | Freq: Once | INTRAMUSCULAR | Status: AC | PRN
  Administered 2014-03-18: 100 mL via INTRAVENOUS

## 2014-03-18 MED ORDER — IOHEXOL 300 MG/ML  SOLN
50.0000 mL | Freq: Once | INTRAMUSCULAR | Status: AC | PRN
  Administered 2014-03-18: 50 mL via ORAL

## 2014-03-18 NOTE — ED Notes (Signed)
Pt c/o R flank pain onset this am, +n/v. Denies urinary s/s

## 2014-03-18 NOTE — ED Provider Notes (Signed)
CSN: [id number]     Arrival date & time 03/18/14  0027 History   First MD Initiated Contact with Patient 03/18/14 0129     Chief Complaint  Patient presents with  . Flank Pain     (Consider location/radiation/quality/duration/timing/severity/associated sxs/prior Treatment) HPI History provided by pt.   Pt presents w/ right-sided abdominal and flank pain that started yesterday morning.  Constant aching sensation that has gradually worsened throughout the day and is currently severe.  No associated fever, cough, SOB, change in bowels, GU sx.  Had a single episode of vomiting en route to hospital.  Has h/o metastatic ovarian cancer, for which she is treated at [facility].  Most recent CT abd/pelvis last month which showed enlarging lung mets.  No h/o kidney stones. Past abd surgeries include total hyst and tumor debulking.   Past Medical History  Diagnosis Date  . Ovarian cancer   . RA (rheumatoid arthritis)    Past Surgical History  Procedure Laterality Date  . Vaginal hysterectomy     No family history on file. History  Substance Use Topics  . Smoking status: Never Smoker   . Smokeless tobacco: Never Used  . Alcohol Use: No   OB History   Grav Para Term Preterm Abortions TAB SAB Ect Mult Living                 Review of Systems  All other systems reviewed and are negative.     Allergies  Lisinopril  Home Medications   Prior to Admission medications   Medication Sig Start Date End Date Taking? Authorizing Provider  cetirizine (ZYRTEC) 10 MG tablet Take 10 mg by mouth daily.   Yes Historical Provider, MD  gabapentin (NEURONTIN) 300 MG capsule Take 300-600 mg by mouth 2 (two) times daily. Take 300mg  in the morning and 600mg  at night   Yes Historical Provider, MD  hydrochlorothiazide (HYDRODIURIL) 25 MG tablet Take 25 mg by mouth daily.   Yes Historical Provider, MD  ibuprofen (ADVIL,MOTRIN) 200 MG tablet Take 400 mg by mouth 2 (two) times daily.   Yes Historical Provider, MD   predniSONE (DELTASONE) 5 MG tablet Take 5 mg by mouth every evening.   Yes Historical Provider, MD   BP 164/107  Pulse 73  Temp(Src) 98.2 F (36.8 C) (Oral)  Resp 20  SpO2 98% Physical Exam  Nursing note and vitals reviewed. Constitutional: She is oriented to person, place, and time. She appears well-developed and well-nourished. No distress.  HENT:  Head: Normocephalic and atraumatic.  Eyes:  Normal appearance  Neck: Normal range of motion.  Cardiovascular: Normal rate and regular rhythm.   Pulmonary/Chest: Effort normal and breath sounds normal. No respiratory distress.  Abdominal: Soft. Bowel sounds are normal. She exhibits no distension. There is no rebound and no guarding.  Diffuse, mild-mod r-sided abd tenderness.  Genitourinary:  R CVA ttp  Musculoskeletal: Normal range of motion.  Neurological: She is alert and oriented to person, place, and time.  Skin: Skin is warm and dry. No rash noted.  Psychiatric: She has a normal mood and affect. Her behavior is normal.    ED Course  Procedures (including critical care time) Labs Review Labs Reviewed  CBC WITH DIFFERENTIAL - Abnormal; Notable for the following:    RBC 3.31 (*)    Hemoglobin 9.9 (*)    HCT 30.5 (*)    All other components within normal limits  COMPREHENSIVE METABOLIC PANEL - Abnormal; Notable for the following:    Sodium  136 (*)    Potassium 2.8 (*)    Chloride 94 (*)    Glucose, Bld 129 (*)    Albumin 3.3 (*)    AST 99 (*)    ALT 66 (*)    Total Bilirubin <0.2 (*)    All other components within normal limits  URINALYSIS, ROUTINE W REFLEX MICROSCOPIC    Imaging Review Ct Abdomen Pelvis W Contrast  03/18/2014   CLINICAL DATA:  Metastatic ovarian cancer with severe right abdominal and flank pain.  EXAM: CT ABDOMEN AND PELVIS WITH CONTRAST  TECHNIQUE: Multidetector CT imaging of the abdomen and pelvis was performed using the standard protocol following bolus administration of intravenous contrast.   CONTRAST:  16mL OMNIPAQUE IOHEXOL 300 MG/ML  SOLN  COMPARISON:  06/14/2008.  FINDINGS: BODY WALL: Muscular atrophy around the hips.  LOWER CHEST: Trace pericardial effusion which is low-density. Mild basilar atelectasis. Posterior right tenth rib fracture with callus. No evidence of underlying malignant bone lesion.  ABDOMEN/PELVIS:  Liver: No focal abnormality.  Biliary: No evidence of biliary obstruction or stone.  Pancreas: Unremarkable.  Spleen: Unremarkable.  Adrenals: Unremarkable.  Kidneys and ureters: No hydronephrosis or stone.  Bladder: Circumferential thickening of the wall, possibly radiation treatment given currently normal urinalysis.  Reproductive: Hysterectomy and oophorectomies. No abnormal tissue in the pelvis. There are prominent periaortic nodes, increased from previous, measuring up to 9 mm short axis.  Bowel: Distal colonic diverticulosis without evidence of active diverticulitis. No bowel obstruction. Normal appendix.  Retroperitoneum: No mass or adenopathy.  Peritoneum: There is small volume pelvic fluid with peritoneal thickening and enhancement. No peritoneal nodularity identified. Trace perihepatic ascites.  Vascular: No acute abnormality.  OSSEOUS: No acute abnormalities.  IMPRESSION: 1. No definitive cause of acute right flank pain. 2. Subacute right tenth rib fracture. 3. Free pelvic fluid with mild peritoneal thickening of uncertain cause. Has there been previous pelvic radiation? 4. Prominent periaortic lymph nodes, indeterminate without more recent surveillance imaging. 5. Colonic diverticulosis.   Electronically Signed   By: Jorje Guild M.D.   On: 03/18/2014 05:24     EKG Interpretation None      MDM   Final diagnoses:  Abdominal pain    53yo F w/ h/o metastatic ovarian cancer, for which she is treated at [facility], presents w/ gradually worsening right-sided and flank pain since yesterday am.  Associated w/ N/V.  Has never had pain like this before.  On exam, afebrile,  uncomfortable but non-toxic appearing, abd soft/non-distended, diffuse mild-mod right-sided abd and R CVA ttp.  U/A unremarkable.  Blood tests and CT abd/pelvis pending.  Pt to receive morphine for pain.  3:06 AM   Labs sig for hypokalemia.  IV/PO KCl ordered.  CT non-acute but shows possibly recent R 10th rib fx.  Results discussed w/ pt.  She again denies recent trauma.  Has been coughing recently but doesn't believe hard enough to crack a rib.  Her pain is improved currently and is non-pleuritic.  D/c'd home w/ vicodin and advised f/u with her oncologist today if possible.  Return precautions discussed.      Remer Macho, PA-C 03/18/14 1505

## 2014-03-18 NOTE — ED Notes (Addendum)
Pt has hx of ovarian cancer. Pt was diagnosed 9 years ago and had surgery then. Pt has had chemo intermittently the past 9 years. Pt c/o R abdominal pain that radiates to her back since this morning. Pt had one episode of vomiting on her way to the ER. Pt c/o persistent nausea. Pt denies diarrhea, fever, chills, weakness. A&Ox4.

## 2014-03-18 NOTE — Discharge Instructions (Signed)
Take vicodin as prescribed for severe pain.  Do not drive within four hours of taking this medication (may cause drowsiness or confusion).   Take ibuprofen as well; up to 800mg  three times a day with food.  Apply a heating pad to sore areas.  Follow up with your oncologist.  You should return to the ER if you develop fever, worsening pain or uncontrolled vomiting.

## 2014-03-19 NOTE — ED Provider Notes (Signed)
Medical screening examination/treatment/procedure(s) were performed by non-physician practitioner and as supervising physician I was immediately available for consultation/collaboration.   EKG Interpretation None        Merryl Hacker, MD 03/19/14 [phone]

## 2015-09-13 DEATH — deceased
